# Patient Record
Sex: Female | Born: 1980 | Race: White | Hispanic: No | Marital: Married | Smoking: Never smoker
Health system: Southern US, Community
[De-identification: ages and names within clinical notes are randomized; demographics above are authoritative.]

## PROBLEM LIST (undated history)

## (undated) ENCOUNTER — Ambulatory Visit: Admission: EM | Payer: 59 | Source: Home / Self Care

## (undated) DIAGNOSIS — E119 Type 2 diabetes mellitus without complications: Secondary | ICD-10-CM

## (undated) DIAGNOSIS — I10 Essential (primary) hypertension: Secondary | ICD-10-CM

## (undated) DIAGNOSIS — R000 Tachycardia, unspecified: Secondary | ICD-10-CM

## (undated) DIAGNOSIS — R519 Headache, unspecified: Secondary | ICD-10-CM

## (undated) DIAGNOSIS — M419 Scoliosis, unspecified: Secondary | ICD-10-CM

## (undated) DIAGNOSIS — R42 Dizziness and giddiness: Secondary | ICD-10-CM

## (undated) HISTORY — PX: OTHER SURGICAL HISTORY: SHX169

## (undated) HISTORY — PX: ANKLE SURGERY: SHX546

---

## 2018-06-21 ENCOUNTER — Encounter: Payer: Self-pay | Admitting: Podiatry

## 2018-06-21 ENCOUNTER — Ambulatory Visit: Payer: BC Managed Care – PPO | Admitting: Podiatry

## 2018-06-21 ENCOUNTER — Other Ambulatory Visit: Payer: Self-pay

## 2018-06-21 VITALS — BP 168/98

## 2018-06-21 DIAGNOSIS — L600 Ingrowing nail: Secondary | ICD-10-CM | POA: Diagnosis not present

## 2018-06-21 DIAGNOSIS — M79676 Pain in unspecified toe(s): Secondary | ICD-10-CM

## 2018-06-21 MED ORDER — NEOMYCIN-POLYMYXIN-HC 3.5-10000-1 OT SOLN: OTIC | 0 refills | Status: DC

## 2018-06-21 NOTE — Patient Instructions (Signed)

## 2018-07-04 ENCOUNTER — Ambulatory Visit: Payer: BC Managed Care – PPO | Admitting: Podiatry

## 2018-07-09 NOTE — Progress Notes (Signed)
Subjective:  Patient ID: Crystal Cabrera, female    DOB: November 02, 1980,  MRN: [id number]  Chief Complaint  Patient presents with  . Ingrown Toenail    ingrown toenail left foot lateral side, going on for 3 days now, pt also states that she stubbed it pretty bad recently, pain is elevated when putting pressure on it    38 y.o. female presents with the above complaint.   Review of Systems: Negative except as noted in the HPI. Denies N/V/F/Ch.  No past medical history on file.  Current Outpatient Medications:  .  albuterol (PROVENTIL) (2.5 MG/3ML) 0.083% nebulizer solution, Inhale into the lungs., Disp: , Rfl:  .  amLODipine (NORVASC) 5 MG tablet, Take by mouth., Disp: , Rfl:  .  aspirin 81 MG chewable tablet, Chew by mouth., Disp: , Rfl:  .  carvedilol (COREG) 12.5 MG tablet, Take by mouth., Disp: , Rfl:  .  cloNIDine (CATAPRES) 0.1 MG tablet, TAKE 1 TABLET (0.1 MG TOTAL) BY MOUTH 2 (TWO) TIMES DAILY, Disp: , Rfl:  .  insulin aspart (NOVOLOG FLEXPEN) 100 UNIT/ML FlexPen, Inject 15 units TID AC plus sliding scale for up to 70 units daily, Disp: , Rfl:  .  Insulin Degludec 200 UNIT/ML SOPN, Inject into the skin., Disp: , Rfl:  .  Insulin Pen Needle (FIFTY50 PEN NEEDLES) 31G X 8 MM MISC, Inject up to 5 times daily, Disp: , Rfl:  .  neomycin-polymyxin-hydrocortisone (CORTISPORIN) OTIC solution, Apply 2 drops to the ingrown toenail site twice daily. Cover with band-aid., Disp: 10 mL, Rfl: 0  Social History   Tobacco Use  Smoking Status Never Smoker  Smokeless Tobacco Never Used    No Known Allergies Objective:   Vitals:   06/21/18 1249  BP: (!) 168/98   There is no height or weight on file to calculate BMI. Constitutional Well developed. Well nourished.  Vascular Dorsalis pedis pulses palpable bilaterally. Posterior tibial pulses palpable bilaterally. Capillary refill normal to all digits.  No cyanosis or clubbing noted. Pedal hair growth normal.  Neurologic Normal speech. Oriented  to person, place, and time. Epicritic sensation to light touch grossly present bilaterally.  Dermatologic Painful ingrowing nail at lateral nail borders of the hallux nail left. No other open wounds. No skin lesions.  Orthopedic: Normal joint ROM without pain or crepitus bilaterally. No visible deformities. No bony tenderness.   Radiographs: None Assessment:   1. Ingrown nail   2. Pain around toenail    Plan:  Patient was evaluated and treated and all questions answered.  Ingrown Nail, left -Patient elects to proceed with minor surgery to remove ingrown toenail removal today. Consent reviewed and signed by patient. -Ingrown nail excised. See procedure note. -Educated on post-procedure care including soaking. Written instructions provided and reviewed. -Patient to follow up in 2 weeks for nail check.  Procedure: Excision of Ingrown Toenail Location: Left 1st lateral nail borders. Anesthesia: Lidocaine 1% plain; 1.5 mL and Marcaine 0.5% plain; 1.5 mL, digital block. Skin Prep: Betadine. Dressing: Silvadene; telfa; dry, sterile, compression dressing. Technique: Following skin prep, the toe was exsanguinated and a tourniquet was secured at the base of the toe. The affected nail border was freed, split with a nail splitter, and excised. Chemical matrixectomy was then performed with phenol and irrigated out with alcohol. The tourniquet was then removed and sterile dressing applied. Disposition: Patient tolerated procedure well. Patient to return in 2 weeks for follow-up.   Return in about 2 weeks (around 07/05/2018) for Nail Check with Nurse.

## 2018-12-25 ENCOUNTER — Ambulatory Visit
Admission: EM | Admit: 2018-12-25 | Discharge: 2018-12-25 | Disposition: A | Discharge status: Home / Self Care | Payer: BC Managed Care – PPO | Attending: Family Medicine | Admitting: Family Medicine

## 2018-12-25 ENCOUNTER — Other Ambulatory Visit: Payer: Self-pay

## 2018-12-25 ENCOUNTER — Encounter: Payer: Self-pay | Admitting: Emergency Medicine

## 2018-12-25 DIAGNOSIS — B349 Viral infection, unspecified: Secondary | ICD-10-CM | POA: Diagnosis not present

## 2018-12-25 DIAGNOSIS — J029 Acute pharyngitis, unspecified: Secondary | ICD-10-CM | POA: Diagnosis not present

## 2018-12-25 HISTORY — DX: Essential (primary) hypertension: I10

## 2018-12-25 HISTORY — DX: Type 2 diabetes mellitus without complications: E11.9

## 2018-12-25 LAB — RAPID STREP SCREEN (MED CTR MEBANE ONLY): Streptococcus, Group A Screen (Direct): NEGATIVE

## 2018-12-25 NOTE — ED Provider Notes (Addendum)
[facility]    CSN: [id number] Arrival date & time: 12/25/18  1546      History   Chief Complaint Chief Complaint  Patient presents with  . Sore Throat  . Generalized Body Aches  . Headache    HPI Crystal Cabrera is a 38 y.o. female.   38 yo female with a c/o sore throat, headache, bodyaches, cough since yesterday. Denies any fevers, shortness of breath.      Past Medical History:  Diagnosis Date  . Diabetes mellitus without complication ([place])   . Hypertension     There are no active problems to display for this patient.   History reviewed. No pertinent surgical history.  OB History   No obstetric history on file.      Home Medications    Prior to Admission medications   Medication Sig Start Date End Date Taking? Authorizing Provider  amLODipine (NORVASC) 5 MG tablet Take by mouth. 01/31/17 03/11/19 Yes [provider]  aspirin 81 MG chewable tablet Chew by mouth.   Yes [provider]  carvedilol (COREG) 12.5 MG tablet Take by mouth. 12/18/16  Yes [provider]  cloNIDine (CATAPRES) 0.1 MG tablet TAKE 1 TABLET (0.1 MG TOTAL) BY MOUTH 2 (TWO) TIMES DAILY 01/18/18  Yes [provider]  insulin aspart (NOVOLOG FLEXPEN) 100 UNIT/ML FlexPen Inject 15 units TID AC plus sliding scale for up to 70 units daily 03/11/18  Yes [provider]  Insulin Degludec 200 UNIT/ML SOPN Inject into the skin. 03/11/18  Yes [provider]  neomycin-polymyxin-hydrocortisone (CORTISPORIN) OTIC solution Apply 2 drops to the ingrown toenail site twice daily. Cover with band-aid. 06/21/18  Yes Evelina Bucy, DPM  albuterol (PROVENTIL) (2.5 MG/3ML) 0.083% nebulizer solution Inhale into the lungs.    [provider]  Insulin Pen Needle (FIFTY50 PEN NEEDLES) 31G X 8 MM MISC Inject up to 5 times daily 07/12/16   [provider]    Family History History reviewed. No pertinent family history.  Social History  Social History   Tobacco Use  . Smoking status: Never Smoker  . Smokeless tobacco: Never Used  Substance Use Topics  . Alcohol use: Not Currently  . Drug use: Never     Allergies   Patient has no known allergies.   Review of Systems Review of Systems   Physical Exam Triage Vital Signs ED Triage Vitals  Enc Vitals Group     BP 12/25/18 1620 127/88     Pulse Rate 12/25/18 1620 (!) 109     Resp 12/25/18 1620 18     Temp 12/25/18 1620 98.9 F (37.2 C)     Temp Source 12/25/18 1620 Oral     SpO2 12/25/18 1620 100 %     Weight 12/25/18 1619 210 lb (95.3 kg)     Height 12/25/18 1619 5\' 5"  (1.651 m)     Head Circumference --      Peak Flow --      Pain Score 12/25/18 1618 4     Pain Loc --      Pain Edu? --      Excl. in [place]? --    No data found.  Updated Vital Signs BP 127/88 (BP Location: Right Arm)   Pulse (!) 109   Temp 98.9 F (37.2 C) (Oral)   Resp 18   Ht 5\' 5"  (1.651 m)   Wt 95.3 kg   LMP 12/11/2018   SpO2 100%   BMI 34.95 kg/m  Visual Acuity Right Eye Distance:   Left Eye Distance:   Bilateral Distance:    Right Eye Near:   Left Eye Near:    Bilateral Near:     Physical Exam Vitals signs and nursing note reviewed.  Constitutional:      General: She is not in acute distress.    Appearance: She is not ill-appearing, toxic-appearing or diaphoretic.  HENT:     Right Ear: Tympanic membrane normal.     Left Ear: Tympanic membrane normal.     Mouth/Throat:     Pharynx: Posterior oropharyngeal erythema present. No oropharyngeal exudate.  Cardiovascular:     Rate and Rhythm: Tachycardia present.  Pulmonary:     Effort: Pulmonary effort is normal. No respiratory distress.     Breath sounds: No stridor. No wheezing, rhonchi or rales.  Neurological:     Mental Status: She is alert.      UC Treatments / Results  Labs (all labs ordered are listed, but only abnormal results are displayed) Labs Reviewed  RAPID STREP SCREEN ([facility]  ONLY)  NOVEL CORONAVIRUS, NAA (HOSP ORDER, SEND-OUT TO REF LAB; TAT 18-24 HRS)  CULTURE, GROUP A STREP [facility])    EKG   Radiology No results found.  Procedures Procedures (including critical care time)  Medications Ordered in UC Medications - No data to display  Initial Impression / Assessment and Plan / UC Course  I have reviewed the triage vital signs and the nursing notes.  Pertinent labs & imaging results that were available during my care of the patient were reviewed by me and considered in my medical decision making (see chart for details).      Final Clinical Impressions(s) / UC Diagnoses   Final diagnoses:  Viral illness  Viral pharyngitis     Discharge Instructions     Rest, fluids, tylenol/motrin Wait test result    ED Prescriptions    None      1. Lab result and diagnosis reviewed with patient 2. covid test done 3. Recommend supportive treatment as above 4. Follow-up prn if symptoms worsen or don't improve   Controlled Substance Prescriptions [place] Controlled Substance Registry consulted? Not Applicable   Payton Mccallumonty, Tylisha Danis, MD 12/25/18 [id number]    Payton Mccallumonty, Detrich Rakestraw, MD 12/25/18 2047

## 2018-12-25 NOTE — Discharge Instructions (Signed)
Rest, fluids, tylenol/motrin Wait test result

## 2018-12-25 NOTE — ED Triage Notes (Signed)
Patient c/o sore throat, headache, body aches and cough that started this morning. Denies fever.

## 2018-12-26 LAB — NOVEL CORONAVIRUS, NAA (HOSP ORDER, SEND-OUT TO REF LAB; TAT 18-24 HRS): SARS-CoV-2, NAA: NOT DETECTED

## 2018-12-28 LAB — CULTURE, GROUP A STREP (THRC)

## 2019-02-20 ENCOUNTER — Ambulatory Visit
Admission: EM | Admit: 2019-02-20 | Discharge: 2019-02-20 | Disposition: A | Discharge status: Home / Self Care | Payer: BC Managed Care – PPO | Attending: Family Medicine | Admitting: Family Medicine

## 2019-02-20 ENCOUNTER — Other Ambulatory Visit: Payer: Self-pay

## 2019-02-20 DIAGNOSIS — R519 Headache, unspecified: Secondary | ICD-10-CM | POA: Diagnosis not present

## 2019-02-20 DIAGNOSIS — R05 Cough: Secondary | ICD-10-CM | POA: Diagnosis not present

## 2019-02-20 DIAGNOSIS — M791 Myalgia, unspecified site: Secondary | ICD-10-CM

## 2019-02-20 DIAGNOSIS — J111 Influenza due to unidentified influenza virus with other respiratory manifestations: Secondary | ICD-10-CM

## 2019-02-20 DIAGNOSIS — R0981 Nasal congestion: Secondary | ICD-10-CM | POA: Diagnosis not present

## 2019-02-20 LAB — RAPID INFLUENZA A&B ANTIGENS
Influenza A (ARMC): NEGATIVE
Influenza B (ARMC): NEGATIVE

## 2019-02-20 MED ORDER — BENZONATATE 200 MG PO CAPS: 200.0000 mg | ORAL_CAPSULE | Freq: Three times a day (TID) | ORAL | 0 refills | Status: DC | PRN

## 2019-02-20 NOTE — Discharge Instructions (Signed)
Rest.   Fluids.  Flu negative. Awaiting COVID test results (available in 24-48 hours).  Tylenol and ibuprofen as needed.  Cough medication as directed.  Take care  Dr. Lacinda Axon

## 2019-02-20 NOTE — ED Provider Notes (Signed)
[facility]    CSN: [id number] Arrival date & time: 02/20/19  1626  History   Chief Complaint Chief Complaint  Patient presents with  . Cough   HPI   38 year old female presents with multiple complaints.  Patient reports that she has not been feeling well since 6 AM this morning.  She reports cough, chills, body aches, headache, nasal congestion.  No documented fever.  Temperature slightly elevated here at 99.7.  No reported sick contacts.  No medications or interventions tried.  Patient is concerned about COVID-19.  No known exacerbating or relieving factors.  No other complaints.  PMH, Surgical Hx, Family Hx, Social History reviewed and updated as below.  Past Medical History:  Diagnosis Date  . Diabetes mellitus without complication ([place])   . Hypertension   Migraine Noncompliance HLD Tachycardia induced cardiomyopathy  Past Surgical History:  Procedure Laterality Date  . ANKLE SURGERY    . leg sugery  rt    C section  OB History   No obstetric history on file.    Home Medications    Prior to Admission medications   Medication Sig Start Date End Date Taking? Authorizing Provider  albuterol (PROVENTIL) (2.5 MG/3ML) 0.083% nebulizer solution Inhale into the lungs.    [provider]  amLODipine (NORVASC) 5 MG tablet Take by mouth. 01/31/17 03/11/19  [provider]  aspirin 81 MG chewable tablet Chew by mouth.    [provider]  benzonatate (TESSALON) 200 MG capsule Take 1 capsule (200 mg total) by mouth 3 (three) times daily as needed for cough. 02/20/19   Coral Spikes, DO  carvedilol (COREG) 12.5 MG tablet Take by mouth. 12/18/16   [provider]  cloNIDine (CATAPRES) 0.1 MG tablet TAKE 1 TABLET (0.1 MG TOTAL) BY MOUTH 2 (TWO) TIMES DAILY 01/18/18   [provider]  insulin aspart (NOVOLOG FLEXPEN) 100 UNIT/ML FlexPen Inject 15 units TID AC plus sliding scale for up to 70 units daily 03/11/18   [provider]  Insulin Degludec 200 UNIT/ML SOPN Inject into the skin. 03/11/18   [provider]  Insulin Pen Needle (FIFTY50 PEN NEEDLES) 31G X 8 MM MISC Inject up to 5 times daily 07/12/16   [provider]  neomycin-polymyxin-hydrocortisone (CORTISPORIN) OTIC solution Apply 2 drops to the ingrown toenail site twice daily. Cover with band-aid. 06/21/18   Evelina Bucy, DPM   Social History Social History   Tobacco Use  . Smoking status: Never Smoker  . Smokeless tobacco: Never Used  Substance Use Topics  . Alcohol use: Not Currently  . Drug use: Never    Allergies   Patient has no known allergies.   Review of Systems Review of Systems  Constitutional: Positive for chills.  HENT: Positive for congestion.   Respiratory: Positive for cough.   Musculoskeletal:       Body aches.  Neurological: Positive for headaches.   Physical Exam Triage Vital Signs ED Triage Vitals  Enc Vitals Group     BP --      Pulse Rate 02/20/19 1654 100     Resp 02/20/19 1654 18     Temp 02/20/19 1654 99.7 F (37.6 C)     Temp Source 02/20/19 1654 Oral     SpO2 02/20/19 1654 98 %     Weight 02/20/19 1652 210 lb (95.3 kg)     Height --      Head Circumference --      Peak Flow --  Pain Score 02/20/19 1652 7     Pain Loc --      Pain Edu? --      Excl. in [place]? --     Updated Vital Signs Pulse 100   Temp 99.7 F (37.6 C) (Oral)   Resp 18   Wt 95.3 kg   LMP 01/29/2019   SpO2 98%   BMI 34.95 kg/m   Visual Acuity Right Eye Distance:   Left Eye Distance:   Bilateral Distance:    Right Eye Near:   Left Eye Near:    Bilateral Near:     Physical Exam Vitals signs and nursing note reviewed.  Constitutional:      General: She is not in acute distress.    Appearance: She is obese. She is not ill-appearing.  HENT:     Head: Normocephalic and atraumatic.     Right Ear: Tympanic membrane normal.     Left Ear: Tympanic membrane normal.     Mouth/Throat:      Pharynx: Oropharynx is clear. No posterior oropharyngeal erythema.  Eyes:     General:        Right eye: No discharge.        Left eye: No discharge.     Conjunctiva/sclera: Conjunctivae normal.  Cardiovascular:     Rate and Rhythm: Normal rate and regular rhythm.     Heart sounds: Murmur present.  Pulmonary:     Effort: Pulmonary effort is normal.     Breath sounds: Normal breath sounds. No wheezing, rhonchi or rales.  Neurological:     Mental Status: She is alert.  Psychiatric:     Comments: Flat affect. Depressed mood.    UC Treatments / Results  Labs (all labs ordered are listed, but only abnormal results are displayed) Labs Reviewed  RAPID INFLUENZA A&B ANTIGENS ([facility] ONLY)  NOVEL CORONAVIRUS, NAA (HOSP ORDER, SEND-OUT TO REF LAB; TAT 18-24 HRS)    EKG   Radiology No results found.  Procedures Procedures (including critical care time)  Medications Ordered in UC Medications - No data to display  Initial Impression / Assessment and Plan / UC Course  I have reviewed the triage vital signs and the nursing notes.  Pertinent labs & imaging results that were available during my care of the patient were reviewed by me and considered in my medical decision making (see chart for details).    38 year old female presents with an influenza-like illness.  Awaiting Covid testing.  Rapid flu negative today.  Supportive care.  Ibuprofen and Tylenol as needed.  Tessalon Perles for cough.  Final Clinical Impressions(s) / UC Diagnoses   Final diagnoses:  Influenza-like illness     Discharge Instructions     Rest.   Fluids.  Flu negative. Awaiting COVID test results (available in 24-48 hours).  Tylenol and ibuprofen as needed.  Cough medication as directed.  Take care  Dr. Adriana Simas    ED Prescriptions    Medication Sig Dispense Auth. Provider   benzonatate (TESSALON) 200 MG capsule Take 1 capsule (200 mg total) by mouth 3 (three) times daily as needed for cough. 30  capsule Tommie Sams, DO     PDMP not reviewed this encounter.   Tommie Sams, DO 02/21/19 0930

## 2019-02-20 NOTE — ED Triage Notes (Addendum)
Pt cc cough, chills and headaches. Pt states she feels very fatigued this started today. Pt states she had a Covid test done here 2 months ago. The results were negative.

## 2019-02-22 LAB — NOVEL CORONAVIRUS, NAA (HOSP ORDER, SEND-OUT TO REF LAB; TAT 18-24 HRS): SARS-CoV-2, NAA: NOT DETECTED

## 2019-08-11 ENCOUNTER — Other Ambulatory Visit: Payer: Self-pay

## 2019-08-11 ENCOUNTER — Ambulatory Visit
Admission: EM | Admit: 2019-08-11 | Discharge: 2019-08-11 | Disposition: A | Discharge status: Home / Self Care | Payer: BC Managed Care – PPO | Attending: Family Medicine | Admitting: Family Medicine

## 2019-08-11 DIAGNOSIS — H6690 Otitis media, unspecified, unspecified ear: Secondary | ICD-10-CM

## 2019-08-11 DIAGNOSIS — J019 Acute sinusitis, unspecified: Secondary | ICD-10-CM | POA: Diagnosis not present

## 2019-08-11 DIAGNOSIS — H60503 Unspecified acute noninfective otitis externa, bilateral: Secondary | ICD-10-CM | POA: Diagnosis not present

## 2019-08-11 MED ORDER — AZITHROMYCIN 250 MG PO TABS: ORAL_TABLET | ORAL | 0 refills | Status: DC

## 2019-08-11 MED ORDER — NEOMYCIN-POLYMYXIN-HC 3.5-10000-1 OT SUSP: 4.0000 [drp] | Freq: Three times a day (TID) | OTIC | 0 refills | Status: AC

## 2019-08-11 NOTE — ED Provider Notes (Signed)
[facility]    CSN: [id number] Arrival date & time: 08/11/19  1313      History   Chief Complaint Chief Complaint  Patient presents with  . Otalgia    HPI Crystal Cabrera is a 39 y.o. female.   Patient is a 39 year old female with history of diabetes and hypertension who presents with complaint of ear pain and facial swelling.  Patient states her left ear started hurting yesterday morning and by last night her right ear was hurting as well.  She reports dizziness vertigo and increased pain this morning.  She states that now her pain is a little bit better but she is worried that it may get worse again tonight.  She does report some nasal and sinus congestion.  She reports a couple of her children were sick couple weeks ago and one was diagnosed with flu.  She states one son is still being treated for sinusitis now.  She reports no decrease loss of hearing but has report of fullness.  She does report a cough.  She does report green for mucus when she blows her nose.  Patient denies chest pain shortness of breath, dull pain or nausea.  She does state she feels like there is mucus in her throat that she needs to cough up.  She states she has been taking Advil and states that she took an amoxicillin yesterday that was leftover from a sinusitis infection she had several months ago.      Past Medical History:  Diagnosis Date  . Diabetes mellitus without complication ([place])   . Hypertension     There are no problems to display for this patient.   Past Surgical History:  Procedure Laterality Date  . ANKLE SURGERY    . leg sugery  rt      OB History   No obstetric history on file.      Home Medications    Prior to Admission medications   Medication Sig Start Date End Date Taking? Authorizing Provider  amLODipine (NORVASC) 5 MG tablet Take by mouth. 01/31/17 08/11/19 Yes [provider]  aspirin 81 MG chewable tablet Chew by mouth.   Yes [provider]    carvedilol (COREG) 12.5 MG tablet Take by mouth. 12/18/16  Yes [provider]  cloNIDine (CATAPRES) 0.1 MG tablet TAKE 1 TABLET (0.1 MG TOTAL) BY MOUTH 2 (TWO) TIMES DAILY 01/18/18  Yes [provider]  insulin aspart (NOVOLOG FLEXPEN) 100 UNIT/ML FlexPen Inject 15 units TID AC plus sliding scale for up to 70 units daily 03/11/18  Yes [provider]  Insulin Aspart, w/Niacinamide, (FIASP FLEXTOUCH [place]) Inject into the skin.   Yes [provider]  Insulin Pen Needle (FIFTY50 PEN NEEDLES) 31G X 8 MM MISC Inject up to 5 times daily 07/12/16  Yes [provider]  azithromycin (ZITHROMAX Z-PAK) 250 MG tablet Take 2 tablets by mouth the first day followed by one tablet daily for next 4 days. 08/11/19   Luvenia Redden, PA-C  neomycin-polymyxin-hydrocortisone (CORTISPORIN) 3.5-10000-1 OTIC suspension Place 4 drops into both ears 3 (three) times daily for 7 days. 08/11/19 08/18/19  Luvenia Redden, PA-C  albuterol (PROVENTIL) (2.5 MG/3ML) 0.083% nebulizer solution Inhale into the lungs.  08/11/19  [provider]    Family History Family History  Problem Relation Age of Onset  . Healthy Mother   . Healthy Father     Social History Social History   Tobacco Use  . Smoking status:  Never Smoker  . Smokeless tobacco: Never Used  Substance Use Topics  . Alcohol use: Not Currently  . Drug use: Never     Allergies   Patient has no known allergies.   Review of Systems Review of Systems as noted in HPI.  Other systems reviewed and found to be negative   Physical Exam Triage Vital Signs ED Triage Vitals  Enc Vitals Group     BP 08/11/19 1335 135/80     Pulse Rate 08/11/19 1335 (!) 107     Resp 08/11/19 1335 18     Temp 08/11/19 1335 98.4 F (36.9 C)     Temp Source 08/11/19 1335 Oral     SpO2 08/11/19 1335 99 %     Weight --      Height --      Head Circumference --      Peak Flow --      Pain Score 08/11/19 1330 5     Pain Loc --       Pain Edu? --      Excl. in [place]? --    No data found.  Updated Vital Signs BP 135/80 (BP Location: Left Arm)   Pulse (!) 107   Temp 98.4 F (36.9 C) (Oral)   Resp 18   LMP 07/28/2019   SpO2 99%    Physical Exam Constitutional:      Appearance: She is ill-appearing.  HENT:     Head: Normocephalic and atraumatic.     Right Ear: Swelling present. A middle ear effusion is present. Tympanic membrane is erythematous.     Left Ear: Swelling present. A middle ear effusion is present. Tympanic membrane is erythematous.     Ears:     Comments: Tragal and preauricular tenderness bilaterally.  Appearance of white discharge on the left TM and possible white discharge in the distal right canal.    Nose: Congestion present.     Right Turbinates: Swollen.     Left Turbinates: Swollen.     Right Sinus: Maxillary sinus tenderness and frontal sinus tenderness present.     Left Sinus: Maxillary sinus tenderness and frontal sinus tenderness present.     Mouth/Throat:     Tonsils: No tonsillar exudate.  Neurological:     Mental Status: She is alert.      UC Treatments / Results  Labs (all labs ordered are listed, but only abnormal results are displayed) Labs Reviewed - No data to display  EKG   Radiology No results found.  Procedures Procedures (including critical care time)  Medications Ordered in UC Medications - No data to display  Initial Impression / Assessment and Plan / UC Course  I have reviewed the triage vital signs and the nursing notes.  Pertinent labs & imaging results that were available during my care of the patient were reviewed by me and considered in my medical decision making (see chart for details).     Patient complaint and exam consistent with sinusitis and otitis media.  Exam also concerning for possible early otitis externa with white discharge on the left dependent membrane and adjacent on the right.  We will give her prescription for azithromycin  for the sinusitis and the otitis media.  Also give her a prescription for Cortisporin attic for the otitis externa.  Recommend her to try over-the-counter Afrin to help with her opening of her nasal and ear passages to help with drainage.  Also recommend nasal saline or Nettie pot to help  rinse her sinuses.  Ibuprofen or Tylenol for pain  Final Clinical Impressions(s) / UC Diagnoses   Final diagnoses:  Acute otitis externa of both ears, unspecified type  Acute otitis media, unspecified otitis media type  Acute sinusitis, recurrence not specified, unspecified location     Discharge Instructions     -azithromycin: Take 2 tablets by mouth the first day followed by one tablet daily for next 4 days. -Cortisporin: 4 drops into both ears three times daily for 7 days -Can use OTC Afrin to help open nasal/ear passages to help with drainage -nasal saline or Nettie Pot to help rinse sinuses -ibuprofen/Tylenol for pain    ED Prescriptions    Medication Sig Dispense Auth. Provider   azithromycin (ZITHROMAX Z-PAK) 250 MG tablet Take 2 tablets by mouth the first day followed by one tablet daily for next 4 days. 6 tablet Candis Schatz, PA-C   neomycin-polymyxin-hydrocortisone (CORTISPORIN) 3.5-10000-1 OTIC suspension Place 4 drops into both ears 3 (three) times daily for 7 days. 10 mL Candis Schatz, PA-C     PDMP not reviewed this encounter.   Candis Schatz, PA-C 08/11/19 1404

## 2019-08-11 NOTE — Discharge Instructions (Addendum)
-  azithromycin: Take 2 tablets by mouth the first day followed by one tablet daily for next 4 days. -Cortisporin: 4 drops into both ears three times daily for 7 days -Can use OTC Afrin to help open nasal/ear passages to help with drainage -nasal saline or Nettie Pot to help rinse sinuses -ibuprofen/Tylenol for pain

## 2019-08-11 NOTE — ED Triage Notes (Signed)
Pt reports L ear pain starting yesterday.  Was initially a fullness.  This morning now has sharp pain in both ears and reports a feeling like they are swollen inside. Reports intermittent vertigo as well as tenderness on cheeks and along sinuses. States her sons have been sick - tested negative for COVID but one tested positive for flu a couple weeks ago.  Took a leftover Amoxicillin 500mg  last night as well as Advil.

## 2019-09-15 ENCOUNTER — Ambulatory Visit
Admission: EM | Admit: 2019-09-15 | Discharge: 2019-09-15 | Disposition: A | Discharge status: Home / Self Care | Payer: BC Managed Care – PPO | Attending: Family Medicine | Admitting: Family Medicine

## 2019-09-15 ENCOUNTER — Other Ambulatory Visit: Payer: Self-pay

## 2019-09-15 DIAGNOSIS — E1165 Type 2 diabetes mellitus with hyperglycemia: Secondary | ICD-10-CM

## 2019-09-15 DIAGNOSIS — Z760 Encounter for issue of repeat prescription: Secondary | ICD-10-CM | POA: Diagnosis not present

## 2019-09-15 DIAGNOSIS — I10 Essential (primary) hypertension: Secondary | ICD-10-CM

## 2019-09-15 DIAGNOSIS — Z794 Long term (current) use of insulin: Secondary | ICD-10-CM | POA: Diagnosis not present

## 2019-09-15 MED ORDER — CARVEDILOL 12.5 MG PO TABS: 12.5000 mg | ORAL_TABLET | Freq: Two times a day (BID) | ORAL | 1 refills | Status: AC

## 2019-09-15 MED ORDER — FIASP FLEXTOUCH 100 UNIT/ML ~~LOC~~ SOPN: 34.0000 [IU] | PEN_INJECTOR | Freq: Three times a day (TID) | SUBCUTANEOUS | 3 refills | Status: AC

## 2019-09-15 MED ORDER — LISINOPRIL 10 MG PO TABS: 10.0000 mg | ORAL_TABLET | Freq: Every day | ORAL | 1 refills | Status: AC

## 2019-09-15 MED ORDER — CLONIDINE HCL 0.1 MG PO TABS: ORAL_TABLET | ORAL | 1 refills | Status: AC

## 2019-09-15 MED ORDER — AMLODIPINE BESYLATE 5 MG PO TABS: 5.0000 mg | ORAL_TABLET | Freq: Every day | ORAL | 1 refills | Status: AC

## 2019-09-15 NOTE — ED Triage Notes (Signed)
Pt states she can't get into her doctor until the end of the month and needs several prescriptions refilled.

## 2019-09-15 NOTE — Discharge Instructions (Addendum)
I have refilled your medications   Follow up with PCP.  Take care  Dr. Adriana Simas

## 2019-09-15 NOTE — ED Provider Notes (Signed)
[facility]    CSN: [id number] Arrival date & time: 09/15/19  1523  History   Chief Complaint Chief Complaint  Patient presents with  . Medication Refill    HPI  39 year old female presents requesting refills of her medications.  Patient has not seen her physician in over 1 year.  Patient states that she is going out of town.  She is in need of refills on her hypertension medications as well as her insulin.  She has uncontrolled diabetes.  She has not taken her blood sugar today.  Patient states that her blood sugars have been worse as this has been a stressful year due to the pandemic.  Her blood pressure is currently well controlled.  She is requesting refills on Fiasp, amlodipine, lisinopril, carvedilol, clonidine.  No other complaints or concerns at this time.  Past Medical History:  Diagnosis Date  . Diabetes mellitus without complication (HCC)   . Hypertension    Past Surgical History:  Procedure Laterality Date  . ANKLE SURGERY    . CESAREAN SECTION    . leg sugery  rt     OB History   No obstetric history on file.    Home Medications    Prior to Admission medications   Medication Sig Start Date End Date Taking? Authorizing Provider  amLODipine (NORVASC) 5 MG tablet Take 1 tablet (5 mg total) by mouth daily. 09/15/19   Tommie Sams, DO  aspirin 81 MG chewable tablet Chew by mouth.    [provider]  carvedilol (COREG) 12.5 MG tablet Take 1 tablet (12.5 mg total) by mouth 2 (two) times daily with a meal. 09/15/19   Taurus Alamo, Verdis Frederickson, DO  cloNIDine (CATAPRES) 0.1 MG tablet TAKE 1 TABLET (0.1 MG TOTAL) BY MOUTH 2 (TWO) TIMES DAILY 09/15/19   Adriana Simas, Rohini Jaroszewski G, DO  insulin aspart (FIASP FLEXTOUCH) 100 UNIT/ML FlexTouch Pen Inject 34 Units into the skin with breakfast, with lunch, and with evening meal. 09/15/19   Clarann Helvey G, DO  insulin aspart (NOVOLOG FLEXPEN) 100 UNIT/ML FlexPen Inject 15 units TID AC plus sliding scale for up to 70 units daily 03/11/18    [provider]  Insulin Pen Needle (FIFTY50 PEN NEEDLES) 31G X 8 MM MISC Inject up to 5 times daily 07/12/16   [provider]  lisinopril (ZESTRIL) 10 MG tablet Take 1 tablet (10 mg total) by mouth daily. 09/15/19   Tommie Sams, DO  albuterol (PROVENTIL) (2.5 MG/3ML) 0.083% nebulizer solution Inhale into the lungs.  08/11/19  [provider]    Family History Family History  Problem Relation Age of Onset  . Healthy Mother   . Healthy Father     Social History Social History   Tobacco Use  . Smoking status: Never Smoker  . Smokeless tobacco: Never Used  Substance Use Topics  . Alcohol use: Not Currently  . Drug use: Never     Allergies   Patient has no known allergies.   Review of Systems Review of Systems Per HPI  Physical Exam Triage Vital Signs ED Triage Vitals  Enc Vitals Group     BP 09/15/19 1534 132/70     Pulse Rate 09/15/19 1534 (!) 120     Resp 09/15/19 1534 19     Temp 09/15/19 1534 98 F (36.7 C)     Temp Source 09/15/19 1534 Oral     SpO2 09/15/19 1534 99 %     Weight 09/15/19 1531 210 lb (95.3  kg)     Height 09/15/19 1531 5\' 5"  (1.651 m)     Head Circumference --      Peak Flow --      Pain Score 09/15/19 1531 0     Pain Loc --      Pain Edu? --      Excl. in [place]? --    Updated Vital Signs BP 132/70 (BP Location: Left Arm)   Pulse (!) 120   Temp 98 F (36.7 C) (Oral)   Resp 19   Ht 5\' 5"  (1.651 m)   Wt 95.3 kg   LMP 08/25/2019   SpO2 99%   BMI 34.95 kg/m   Visual Acuity Right Eye Distance:   Left Eye Distance:   Bilateral Distance:    Right Eye Near:   Left Eye Near:    Bilateral Near:     Physical Exam Vitals and nursing note reviewed.  Constitutional:      General: She is not in acute distress.    Appearance: Normal appearance. She is obese. She is not ill-appearing.  HENT:     Head: Normocephalic and atraumatic.  Eyes:     General:        Right eye: No discharge.        Left eye: No discharge.      Conjunctiva/sclera: Conjunctivae normal.  Cardiovascular:     Rate and Rhythm: Regular rhythm. Tachycardia present.  Pulmonary:     Effort: Pulmonary effort is normal.     Breath sounds: Normal breath sounds. No wheezing, rhonchi or rales.  Neurological:     Mental Status: She is alert.  Psychiatric:        Mood and Affect: Mood normal.        Behavior: Behavior normal.    UC Treatments / Results  Labs (all labs ordered are listed, but only abnormal results are displayed) Labs Reviewed - No data to display  EKG   Radiology No results found.  Procedures Procedures (including critical care time)  Medications Ordered in UC Medications - No data to display  Initial Impression / Assessment and Plan / UC Course  I have reviewed the triage vital signs and the nursing notes.  Pertinent labs & imaging results that were available during my care of the patient were reviewed by me and considered in my medical decision making (see chart for details).    39 year old female presents requesting medication refill.  Diabetes remains uncontrolled.  Patient reports that her blood sugars have been worse since the pandemic.  Patient states that she is scheduled for primary care follow-up later this month.  Fiasp refilled.  Blood pressure currently well controlled.  Amlodipine, lisinopril, carvedilol, clonidine refilled today.   Final Clinical Impressions(s) / UC Diagnoses   Final diagnoses:  Medication refill  Type 2 diabetes mellitus with hyperglycemia, with long-term current use of insulin ([place])  Essential hypertension     Discharge Instructions     I have refilled your medications   Follow up with PCP.  Take care  Dr. Lacinda Axon     ED Prescriptions    Medication Sig Dispense Auth. Provider   amLODipine (NORVASC) 5 MG tablet Take 1 tablet (5 mg total) by mouth daily. 30 tablet Neo Yepiz G, DO   carvedilol (COREG) 12.5 MG tablet Take 1 tablet (12.5 mg total) by mouth 2  (two) times daily with a meal. 60 tablet Jaymie Mckiddy G, DO   cloNIDine (CATAPRES) 0.1 MG tablet TAKE 1 TABLET (  0.1 MG TOTAL) BY MOUTH 2 (TWO) TIMES DAILY 60 tablet Gatlin Kittell G, DO   lisinopril (ZESTRIL) 10 MG tablet Take 1 tablet (10 mg total) by mouth daily. 30 tablet Vladislav Axelson G, DO   insulin aspart (FIASP FLEXTOUCH) 100 UNIT/ML FlexTouch Pen Inject 34 Units into the skin with breakfast, with lunch, and with evening meal. 90 mL Everlene Other G, DO     PDMP not reviewed this encounter.   Tommie Sams, [place] 09/15/19 1810

## 2019-10-08 ENCOUNTER — Other Ambulatory Visit: Payer: Self-pay | Admitting: Family Medicine

## 2019-11-17 ENCOUNTER — Other Ambulatory Visit: Payer: Self-pay | Admitting: Family Medicine

## 2020-04-19 ENCOUNTER — Ambulatory Visit
Admission: EM | Admit: 2020-04-19 | Discharge: 2020-04-19 | Disposition: A | Discharge status: Home / Self Care | Payer: BC Managed Care – PPO | Attending: Family Medicine | Admitting: Family Medicine

## 2020-04-19 ENCOUNTER — Other Ambulatory Visit: Payer: Self-pay

## 2020-04-19 DIAGNOSIS — Z79899 Other long term (current) drug therapy: Secondary | ICD-10-CM | POA: Insufficient documentation

## 2020-04-19 DIAGNOSIS — Z20822 Contact with and (suspected) exposure to covid-19: Secondary | ICD-10-CM | POA: Insufficient documentation

## 2020-04-19 DIAGNOSIS — J069 Acute upper respiratory infection, unspecified: Secondary | ICD-10-CM | POA: Diagnosis present

## 2020-04-19 MED ORDER — PROMETHAZINE-DM 6.25-15 MG/5ML PO SYRP: 5.0000 mL | ORAL_SOLUTION | Freq: Four times a day (QID) | ORAL | 0 refills | Status: DC | PRN

## 2020-04-19 MED ORDER — BENZONATATE 100 MG PO CAPS: 200.0000 mg | ORAL_CAPSULE | Freq: Three times a day (TID) | ORAL | 0 refills | Status: DC

## 2020-04-19 MED ORDER — ALBUTEROL SULFATE HFA 108 (90 BASE) MCG/ACT IN AERS: 2.0000 | INHALATION_SPRAY | RESPIRATORY_TRACT | 0 refills | Status: DC | PRN

## 2020-04-19 MED ORDER — AEROCHAMBER MV MISC: 2 refills | Status: DC

## 2020-04-19 NOTE — Discharge Instructions (Addendum)
Isolate at home until the results of your COVID test are back.  If the test is positive you will need to quarantine for 5 additional days.  After the 5 days if your symptoms have improved then you can break quarantine.  Use the albuterol inhaler with a spacer, 2 puffs every 4-6 hours, as needed for shortness of breath and wheezing.  Use the Tessalon Perles during the day as needed for cough and the Promethazine DM at nighttime as needed for cough and congestion.  If you develop worsening shortness of breath-especially at rest, you are unable to speak full sentences, or you develop bluing of your lips you need to go to the ER for evaluation.

## 2020-04-19 NOTE — ED Provider Notes (Signed)
[facility]    CSN: [id number] Arrival date & time: 04/19/20  1542      History   Chief Complaint Chief Complaint  Patient presents with  . Generalized Body Aches    HPI Crystal Cabrera is a 40 y.o. female.   HPI   40 year old female here for evaluation of COVID-like symptoms that been going on the past 5 days.  Patient reports that she has been experiencing headaches, body aches, runny nose and nasal congestion, ear pressure, productive cough, shortness of breath and wheezing.  Patient denies GI complaints.  Patient has had both of her COVID-vaccine plus her booster shot.  Patient states that when she first developed symptoms she [facility] which was [facility] and also reports that her symptoms have been worsening ever since.  Patient is concerned because she has been admitted to the ICU for pneumonia in the past.  Past Medical History:  Diagnosis Date  . Diabetes mellitus without complication (HCC)   . Hypertension     There are no problems to display for this patient.   Past Surgical History:  Procedure Laterality Date  . ANKLE SURGERY    . CESAREAN SECTION    . leg sugery  rt      OB History   No obstetric history on file.      Home Medications    Prior to Admission medications   Medication Sig Start Date End Date Taking? Authorizing Provider  albuterol (VENTOLIN HFA) 108 (90 Base) MCG/ACT inhaler Inhale 2 puffs into the lungs every 4 (four) hours as needed. 04/19/20  Yes Becky Augusta, NP  amLODipine (NORVASC) 5 MG tablet Take 1 tablet (5 mg total) by mouth daily. 09/15/19  Yes Cook, Jayce G, DO  aspirin 81 MG chewable tablet Chew by mouth.   Yes [provider]  benzonatate (TESSALON) 100 MG capsule Take 2 capsules (200 mg total) by mouth every 8 (eight) hours. 04/19/20  Yes Becky Augusta, NP  carvedilol (COREG) 12.5 MG tablet Take 1 tablet (12.5 mg total) by mouth 2 (two) times daily with a meal. 09/15/19  Yes Cook, Jayce G, DO   cloNIDine (CATAPRES) 0.1 MG tablet TAKE 1 TABLET (0.1 MG TOTAL) BY MOUTH 2 (TWO) TIMES DAILY 09/15/19  Yes Cook, Jayce G, DO  insulin aspart (FIASP FLEXTOUCH) 100 UNIT/ML FlexTouch Pen Inject 34 Units into the skin with breakfast, with lunch, and with evening meal. 09/15/19  Yes Cook, Jayce G, DO  insulin aspart (NOVOLOG) 100 UNIT/ML FlexPen Inject 15 units TID AC plus sliding scale for up to 70 units daily 03/11/18  Yes [provider]  Insulin Pen Needle 31G X 8 MM MISC Inject up to 5 times daily 07/12/16  Yes [provider]  lisinopril (ZESTRIL) 10 MG tablet Take 1 tablet (10 mg total) by mouth daily. 09/15/19  Yes Cook, Jayce G, DO  promethazine-dextromethorphan (PROMETHAZINE-DM) 6.25-15 MG/5ML syrup Take 5 mLs by mouth 4 (four) times daily as needed. 04/19/20  Yes Becky Augusta, NP  Spacer/Aero-Holding Chambers (AEROCHAMBER MV) inhaler Use as instructed 04/19/20  Yes Becky Augusta, NP  TRESIBA FLEXTOUCH 200 UNIT/ML FlexTouch Pen Inject into the skin. 03/25/20  Yes [provider]    Family History Family History  Problem Relation Age of Onset  . Healthy Mother   . Healthy Father     Social History Social History   Tobacco Use  . Smoking status: Never Smoker  . Smokeless tobacco: Never Used  Vaping  Use  . Vaping Use: Never used  Substance Use Topics  . Alcohol use: Not Currently  . Drug use: Never     Allergies   Patient has no known allergies.   Review of Systems Review of Systems  Constitutional: Negative for fever.  HENT: Positive for congestion, ear pain, rhinorrhea and sore throat.   Respiratory: Positive for cough, shortness of breath and wheezing.   Gastrointestinal: Negative for vomiting.  Musculoskeletal: Positive for arthralgias and myalgias.  Skin: Negative for rash.  Neurological: Positive for headaches.  Hematological: Negative.   Psychiatric/Behavioral: Negative.      Physical Exam Triage Vital Signs ED Triage Vitals  Enc Vitals  Group     BP 04/19/20 1739 (!) 152/89     Pulse Rate 04/19/20 1739 (!) 125     Resp 04/19/20 1739 18     Temp 04/19/20 1739 98.1 F (36.7 C)     Temp Source 04/19/20 1739 Oral     SpO2 04/19/20 1739 100 %     Weight 04/19/20 1736 220 lb 12.8 oz (100.2 kg)     Height --      Head Circumference --      Peak Flow --      Pain Score 04/19/20 1736 6     Pain Loc --      Pain Edu? --      Excl. in [place]? --    No data found.  Updated Vital Signs BP (!) 152/89 (BP Location: Left Arm)   Pulse (!) 125   Temp 98.1 F (36.7 C) (Oral)   Resp 18   Wt 220 lb 12.8 oz (100.2 kg)   LMP 03/29/2020   SpO2 100%   BMI 36.74 kg/m   Visual Acuity Right Eye Distance:   Left Eye Distance:   Bilateral Distance:    Right Eye Near:   Left Eye Near:    Bilateral Near:     Physical Exam Vitals and nursing note reviewed.  Constitutional:      General: She is not in acute distress.    Appearance: Normal appearance. She is not toxic-appearing.  HENT:     Head: Normocephalic and atraumatic.     Right Ear: Tympanic membrane, ear canal and external ear normal.     Left Ear: Tympanic membrane, ear canal and external ear normal.     Nose: Congestion and rhinorrhea present.     Comments: Nasal mucosa is erythematous and edematous with clear nasal discharge.    Mouth/Throat:     Mouth: Mucous membranes are moist.     Pharynx: Oropharynx is clear. Posterior oropharyngeal erythema present.     Comments: Posterior oropharynx has mild erythema with clear postnasal drip. Cardiovascular:     Rate and Rhythm: Normal rate and regular rhythm.     Pulses: Normal pulses.     Heart sounds: Normal heart sounds. No murmur heard. No gallop.   Pulmonary:     Effort: Pulmonary effort is normal.     Breath sounds: Normal breath sounds. No wheezing, rhonchi or rales.  Skin:    General: Skin is warm and dry.     Capillary Refill: Capillary refill takes less than 2 seconds.     Findings: No erythema or rash.   Neurological:     General: No focal deficit present.     Mental Status: She is alert and oriented to person, place, and time.  Psychiatric:        Mood and Affect: Mood  normal.        Behavior: Behavior normal.        Thought Content: Thought content normal.        Judgment: Judgment normal.      UC Treatments / Results  Labs (all labs ordered are listed, but only abnormal results are displayed) Labs Reviewed  SARS CORONAVIRUS 2 (TAT 6-24 HRS)    EKG   Radiology No results found.  Procedures Procedures (including critical care time)  Medications Ordered in UC Medications - No data to display  Initial Impression / Assessment and Plan / UC Course  I have reviewed the triage vital signs and the nursing notes.  Pertinent labs & imaging results that were available during my care of the patient were reviewed by me and considered in my medical decision making (see chart for details).   For evaluation of COVID-like symptoms that been going on for the past 5 days.  Patient reports that her symptoms have been worsening and not improving.  Patient has been tested for COVID at the early onset of her symptoms which was negative.  Patient is in no acute distress.  Will swab patient for COVID and discharge patient home with an albuterol inhaler and spacer for shortness of breath and wheezing, Tessalon Perles and Promethazine DM for cough and congestion, and have patient isolate pending the results of her COVID test.  Given the patient is at 5 days with her symptoms not improving will have patient quarantine for additional 5 days if her test is positive.   Final Clinical Impressions(s) / UC Diagnoses   Final diagnoses:  Viral URI with cough     Discharge Instructions     Isolate at home until the results of your COVID test are back.  If the test is positive you will need to quarantine for 5 additional days.  After the 5 days if your symptoms have improved then you can break  quarantine.  Use the albuterol inhaler with a spacer, 2 puffs every 4-6 hours, as needed for shortness of breath and wheezing.  Use the Tessalon Perles during the day as needed for cough and the Promethazine DM at nighttime as needed for cough and congestion.  If you develop worsening shortness of breath-especially at rest, you are unable to speak full sentences, or you develop bluing of your lips you need to go to the ER for evaluation.    ED Prescriptions    Medication Sig Dispense Auth. Provider   benzonatate (TESSALON) 100 MG capsule Take 2 capsules (200 mg total) by mouth every 8 (eight) hours. 21 capsule Becky Augusta, NP   Spacer/Aero-Holding Chambers (AEROCHAMBER MV) inhaler Use as instructed 1 each Becky Augusta, NP   albuterol (VENTOLIN HFA) 108 (90 Base) MCG/ACT inhaler Inhale 2 puffs into the lungs every 4 (four) hours as needed. 18 g Becky Augusta, NP   promethazine-dextromethorphan (PROMETHAZINE-DM) 6.25-15 MG/5ML syrup Take 5 mLs by mouth 4 (four) times daily as needed. 118 mL Becky Augusta, NP     PDMP not reviewed this encounter.   Becky Augusta, NP 04/19/20 1806

## 2020-04-19 NOTE — ED Triage Notes (Signed)
Patient complains of body aches, cough, sore throat, body aches, nasal congestion, headaches x 5 days. States that she was [facility]bed at [facility] the day her symptoms started but has been worsening since.

## 2020-04-20 LAB — SARS CORONAVIRUS 2 (TAT 6-24 HRS): SARS Coronavirus 2: NEGATIVE

## 2020-05-18 ENCOUNTER — Other Ambulatory Visit: Payer: Self-pay | Admitting: Otolaryngology

## 2020-05-18 DIAGNOSIS — R42 Dizziness and giddiness: Secondary | ICD-10-CM

## 2020-05-25 ENCOUNTER — Other Ambulatory Visit: Payer: Self-pay

## 2020-05-25 ENCOUNTER — Ambulatory Visit
Admission: RE | Admit: 2020-05-25 | Discharge: 2020-05-25 | Disposition: A | Discharge status: Home / Self Care | Payer: BC Managed Care – PPO | Source: Ambulatory Visit | Attending: Otolaryngology | Admitting: Otolaryngology

## 2020-05-25 DIAGNOSIS — R42 Dizziness and giddiness: Secondary | ICD-10-CM | POA: Diagnosis not present

## 2020-05-25 MED ORDER — GADOBUTROL 1 MMOL/ML IV SOLN
10.0000 mL | Freq: Once | INTRAVENOUS | Status: AC | PRN
  Administered 2020-05-25: 10 mL via INTRAVENOUS

## 2020-05-27 ENCOUNTER — Emergency Department: Payer: BC Managed Care – PPO

## 2020-05-27 ENCOUNTER — Other Ambulatory Visit: Payer: Self-pay

## 2020-05-27 ENCOUNTER — Emergency Department
Admission: EM | Admit: 2020-05-27 | Discharge: 2020-05-27 | Disposition: A | Discharge status: Home / Self Care | Payer: BC Managed Care – PPO | Attending: Student in an Organized Health Care Education/Training Program | Admitting: Student in an Organized Health Care Education/Training Program

## 2020-05-27 DIAGNOSIS — E119 Type 2 diabetes mellitus without complications: Secondary | ICD-10-CM | POA: Diagnosis not present

## 2020-05-27 DIAGNOSIS — R519 Headache, unspecified: Secondary | ICD-10-CM | POA: Diagnosis not present

## 2020-05-27 DIAGNOSIS — I10 Essential (primary) hypertension: Secondary | ICD-10-CM | POA: Insufficient documentation

## 2020-05-27 DIAGNOSIS — Z7982 Long term (current) use of aspirin: Secondary | ICD-10-CM | POA: Insufficient documentation

## 2020-05-27 DIAGNOSIS — Z794 Long term (current) use of insulin: Secondary | ICD-10-CM | POA: Diagnosis not present

## 2020-05-27 DIAGNOSIS — Z79899 Other long term (current) drug therapy: Secondary | ICD-10-CM | POA: Diagnosis not present

## 2020-05-27 DIAGNOSIS — R202 Paresthesia of skin: Secondary | ICD-10-CM | POA: Diagnosis not present

## 2020-05-27 LAB — COMPREHENSIVE METABOLIC PANEL
ALT: 45 U/L — ABNORMAL HIGH (ref 0–44)
AST: 39 U/L (ref 15–41)
Albumin: 3.8 g/dL (ref 3.5–5.0)
Alkaline Phosphatase: 55 U/L (ref 38–126)
Anion gap: 13 (ref 5–15)
BUN: 13 mg/dL (ref 6–20)
CO2: 25 mmol/L (ref 22–32)
Calcium: 9.1 mg/dL (ref 8.9–10.3)
Chloride: 95 mmol/L — ABNORMAL LOW (ref 98–111)
Creatinine, Ser: 0.68 mg/dL (ref 0.44–1.00)
GFR, Estimated: >60 mL/min (ref >60)
Glucose, Bld: 361 mg/dL — ABNORMAL HIGH (ref 70–99)
Potassium: 4.2 mmol/L (ref 3.5–5.1)
Sodium: 133 mmol/L — ABNORMAL LOW (ref 135–145)
Total Bilirubin: 0.8 mg/dL (ref 0.3–1.2)
Total Protein: 7.2 g/dL (ref 6.5–8.1)

## 2020-05-27 LAB — URINALYSIS, ROUTINE W REFLEX MICROSCOPIC
Bacteria, UA: NONE SEEN
Bilirubin Urine: NEGATIVE
Glucose, UA: >=500 mg/dL — AB
Ketones, ur: 20 mg/dL — AB
Leukocytes,Ua: NEGATIVE
Nitrite: NEGATIVE
Protein, ur: NEGATIVE mg/dL
RBC / HPF: >50 RBC/hpf — ABNORMAL HIGH (ref 0–5)
Specific Gravity, Urine: 1.029 (ref 1.005–1.030)
pH: 5 (ref 5.0–8.0)

## 2020-05-27 LAB — CBC WITH DIFFERENTIAL/PLATELET
Abs Immature Granulocytes: 0.05 10*3/uL (ref 0.00–0.07)
Basophils Absolute: 0.1 10*3/uL (ref 0.0–0.1)
Basophils Relative: 1 %
Eosinophils Absolute: 0.5 10*3/uL (ref 0.0–0.5)
Eosinophils Relative: 6 %
HCT: 41 % (ref 36.0–46.0)
Hemoglobin: 13.9 g/dL (ref 12.0–15.0)
Immature Granulocytes: 1 %
Lymphocytes Relative: 31 %
Lymphs Abs: 2.8 10*3/uL (ref 0.7–4.0)
MCH: 28.4 pg (ref 26.0–34.0)
MCHC: 33.9 g/dL (ref 30.0–36.0)
MCV: 83.7 fL (ref 80.0–100.0)
Monocytes Absolute: 0.9 10*3/uL (ref 0.1–1.0)
Monocytes Relative: 10 %
Neutro Abs: 4.7 10*3/uL (ref 1.7–7.7)
Neutrophils Relative %: 51 %
Platelets: 292 10*3/uL (ref 150–400)
RBC: 4.9 MIL/uL (ref 3.87–5.11)
RDW: 13.4 % (ref 11.5–15.5)
WBC: 9 10*3/uL (ref 4.0–10.5)
nRBC: 0 % (ref 0.0–0.2)

## 2020-05-27 LAB — POC URINE PREG, ED: Preg Test, Ur: NEGATIVE

## 2020-05-27 MED ORDER — DIPHENHYDRAMINE HCL 50 MG/ML IJ SOLN
12.5000 mg | Freq: Once | INTRAMUSCULAR | Status: AC
  Administered 2020-05-27: 12.5 mg via INTRAVENOUS
  Filled 2020-05-27: qty 1

## 2020-05-27 MED ORDER — IOHEXOL 350 MG/ML SOLN
75.0000 mL | Freq: Once | INTRAVENOUS | Status: AC | PRN
  Administered 2020-05-27: 75 mL via INTRAVENOUS

## 2020-05-27 MED ORDER — PROCHLORPERAZINE EDISYLATE 10 MG/2ML IJ SOLN
10.0000 mg | Freq: Once | INTRAMUSCULAR | Status: AC
  Administered 2020-05-27: 10 mg via INTRAVENOUS
  Filled 2020-05-27: qty 2

## 2020-05-27 MED ORDER — SODIUM CHLORIDE 0.9 % IV BOLUS
1000.0000 mL | Freq: Once | INTRAVENOUS | Status: AC
  Administered 2020-05-27: 1000 mL via INTRAVENOUS

## 2020-05-27 MED ORDER — ACETAMINOPHEN 500 MG PO TABS
1000.0000 mg | ORAL_TABLET | Freq: Once | ORAL | Status: AC
  Administered 2020-05-27: 1000 mg via ORAL
  Filled 2020-05-27: qty 2

## 2020-05-27 NOTE — ED Notes (Signed)
D/C discussed with pt and mom both verbalized understanding. NAD noted.

## 2020-05-27 NOTE — ED Notes (Signed)
Pt at CT

## 2020-05-27 NOTE — Discharge Instructions (Signed)

## 2020-05-27 NOTE — ED Triage Notes (Addendum)
Pt states she has been seeing an ENT for sinus infection with vertigo issues for the past month and today when she woke up she had numbness from the bridge of her nose down around her mouth and chin that lasted for about , c/o having a severe headache, states she felt fine when she went to bed last night. ENT did a MRI on Tuesday, results in the chart

## 2020-05-27 NOTE — ED Notes (Signed)
Waiting for IVF to finishing infusing before D/C.

## 2020-05-27 NOTE — ED Notes (Signed)
POC urine preg, Negative, MD aware.  Pt passed yale swallow screen, MD aware. Tylenol given after yale swallow screen passed, MD aware.

## 2020-05-27 NOTE — ED Notes (Signed)
Pt presents to ED with c/o of having numbness around here lips and nose that started this morning upon awakening and also a headache that is "severe". Pt denies a thunderclap type headache. Pt states she has been having issues with her sinuses and vertigo. Pt states she is a type 1 diabetic, hypertension, and also states taking meclizine PTA. Pt states she is no longer having the numbness and states the episode lasted about about 30 minutes. Pt denies any issues with ambulation just "haziness in my head". Pt has equal strengths bilaterally and is also A&OX4. Pt denies having issues with speech or comprehending what other people are saying. Pt states recent MRI of sinuses.

## 2020-05-27 NOTE — ED Provider Notes (Signed)
[facility] Emergency Department Provider Note    Event Date/Time   First MD Initiated Contact with Patient 05/27/20 [phone]     (approximate)  I have reviewed the triage vital signs and the nursing notes.   HISTORY  Chief Complaint  Numbness    HPI Crystal Cabrera is a 40 y.o. female with the below listed past medical history with several weeks of severe sinusitis symptoms being seen by ENT also having vertigo symptoms on meclizine presents to the ER for episode this morning when she woke up with feeling foggy and confused as well as having tingling sensation of the bridge of her nose going around her mouth and this is on both sides.  The symptoms lasted roughly 20 to 30 minutes.  No other associated weakness or discomfort no recent fevers.  Denies any numbness or tingling at this time.  She took a dose of meclizine when she woke up feeling foggy headed thinking that this is oncoming vertigo.    Past Medical History:  Diagnosis Date  . Diabetes mellitus without complication (HCC)   . Hypertension    Family History  Problem Relation Age of Onset  . Healthy Mother   . Healthy Father    Past Surgical History:  Procedure Laterality Date  . ANKLE SURGERY    . CESAREAN SECTION    . leg sugery  rt     There are no problems to display for this patient.     Prior to Admission medications   Medication Sig Start Date End Date Taking? Authorizing Provider  albuterol (VENTOLIN HFA) 108 (90 Base) MCG/ACT inhaler Inhale 2 puffs into the lungs every 4 (four) hours as needed. 04/19/20  Yes Becky Augusta, NP  amLODipine (NORVASC) 5 MG tablet Take 1 tablet (5 mg total) by mouth daily. 09/15/19  Yes Everlene Other G, DO  aspirin 81 MG chewable tablet Chew 81 mg by mouth daily.   Yes [provider]  atorvastatin (LIPITOR) 10 MG tablet Take 10 mg by mouth daily. 04/25/20  Yes [provider]  azelastine (ASTELIN) 0.1 % nasal spray SMARTSIG:1-2 Spray(s) Both  Nares Every 12 Hours PRN 05/18/20  Yes [provider]  carvedilol (COREG) 12.5 MG tablet Take 1 tablet (12.5 mg total) by mouth 2 (two) times daily with a meal. 09/15/19  Yes Cook, Jayce G, DO  cefdinir (OMNICEF) 300 MG capsule Take 300 mg by mouth 2 (two) times daily. 05/18/20  Yes [provider]  cloNIDine (CATAPRES) 0.1 MG tablet TAKE 1 TABLET (0.1 MG TOTAL) BY MOUTH 2 (TWO) TIMES DAILY 09/15/19  Yes Cook, Jayce G, DO  fluticasone (FLONASE) 50 MCG/ACT nasal spray Place 2 sprays into both nostrils daily. 05/18/20  Yes [provider]  ibuprofen (ADVIL) 200 MG tablet Take 400 mg by mouth every 6 (six) hours as needed.   Yes [provider]  insulin aspart (FIASP FLEXTOUCH) 100 UNIT/ML FlexTouch Pen Inject 34 Units into the skin with breakfast, with lunch, and with evening meal. 09/15/19  Yes Cook, Jayce G, DO  lisinopril (ZESTRIL) 10 MG tablet Take 1 tablet (10 mg total) by mouth daily. 09/15/19  Yes Cook, Jayce G, DO  meclizine (ANTIVERT) 25 MG tablet Take 25 mg by mouth 3 (three) times daily as needed. 05/13/20  Yes [provider]  TRESIBA FLEXTOUCH 200 UNIT/ML FlexTouch Pen Inject 70 Units into the skin daily. 03/25/20  Yes [provider]    Allergies Patient has no known allergies.  Social History Social History   Tobacco Use  . Smoking status: Never Smoker  . Smokeless tobacco: Never Used  Vaping Use  . Vaping Use: Never used  Substance Use Topics  . Alcohol use: Not Currently  . Drug use: Never    Review of Systems Patient denies headaches, rhinorrhea, blurry vision, numbness, shortness of breath, chest pain, edema, cough, abdominal pain, nausea, vomiting, diarrhea, dysuria, fevers, rashes or hallucinations unless otherwise stated above in HPI. ____________________________________________   PHYSICAL EXAM:  VITAL SIGNS: Vitals:   05/27/20 0948 05/27/20 1030  BP: 133/77 (!) 147/77  Pulse:  (!) 101  Resp: 15 (!) 21  Temp:     SpO2: 98% 100%    Constitutional: Alert and oriented.  Eyes: Conjunctivae are normal.  Head: Atraumatic. Nose: No congestion/rhinnorhea. Mouth/Throat: Mucous membranes are moist.   Neck: No stridor. Painless ROM.  Cardiovascular: Normal rate, regular rhythm. Grossly normal heart sounds.  Good peripheral circulation. Respiratory: Normal respiratory effort.  No retractions. Lungs CTAB. Gastrointestinal: Soft and nontender. No distention. No abdominal bruits. No CVA tenderness. Genitourinary:  Musculoskeletal: No lower extremity tenderness nor edema.  No joint effusions. Neurologic:  CN- intact.  No facial droop, Normal FNF.  Normal heel to shin.  Sensation intact bilaterally. Normal speech and language. No gross focal neurologic deficits are appreciated. No gait instability. Skin:  Skin is warm, dry and intact. No rash noted. Psychiatric: Mood and affect are normal. Speech and behavior are normal.  ____________________________________________   LABS (all labs ordered are listed, but only abnormal results are displayed)  Results for orders placed or performed during the hospital encounter of 05/27/20 (from the past 24 hour(s))  POC urine preg, ED     Status: None   Collection Time: 05/27/20  9:42 AM  Result Value Ref Range   Preg Test, Ur Negative Negative  Urinalysis, Routine w reflex microscopic Urine, Clean Catch     Status: Abnormal   Collection Time: 05/27/20  9:43 AM  Result Value Ref Range   Color, Urine YELLOW (A) YELLOW   APPearance HAZY (A) CLEAR   Specific Gravity, Urine 1.029 1.005 - 1.030   pH 5.0 5.0 - 8.0   Glucose, UA >=500 (A) NEGATIVE mg/dL   Hgb urine dipstick LARGE (A) NEGATIVE   Bilirubin Urine NEGATIVE NEGATIVE   Ketones, ur 20 (A) NEGATIVE mg/dL   Protein, ur NEGATIVE NEGATIVE mg/dL   Nitrite NEGATIVE NEGATIVE   Leukocytes,Ua NEGATIVE NEGATIVE   RBC / HPF >50 (H) 0 - 5 RBC/hpf   WBC, UA 0-5 0 - 5 WBC/hpf   Bacteria, UA NONE SEEN NONE SEEN   Squamous  Epithelial / LPF 0-5 0 - 5   Mucus PRESENT   CBC WITH DIFFERENTIAL     Status: None   Collection Time: 05/27/20  9:43 AM  Result Value Ref Range   WBC 9.0 4.0 - 10.5 K/uL   RBC 4.90 3.87 - 5.11 MIL/uL   Hemoglobin 13.9 12.0 - 15.0 g/dL   HCT 16.141.0 09.636.0 - 04.546.0 %   MCV 83.7 80.0 - 100.0 fL   MCH 28.4 26.0 - 34.0 pg   MCHC 33.9 30.0 - 36.0 g/dL   RDW 40.913.4 81.111.5 - 91.415.5 %   Platelets 292 150 - 400 K/uL   nRBC 0.0 0.0 - 0.2 %   Neutrophils Relative % 51 %   Neutro Abs 4.7 1.7 - 7.7 K/uL   Lymphocytes Relative 31 %   Lymphs Abs 2.8 0.7 - 4.0 K/uL  Monocytes Relative 10 %   Monocytes Absolute 0.9 0.1 - 1.0 K/uL   Eosinophils Relative 6 %   Eosinophils Absolute 0.5 0.0 - 0.5 K/uL   Basophils Relative 1 %   Basophils Absolute 0.1 0.0 - 0.1 K/uL   Immature Granulocytes 1 %   Abs Immature Granulocytes 0.05 0.00 - 0.07 K/uL  Comprehensive metabolic panel     Status: Abnormal   Collection Time: 05/27/20  9:43 AM  Result Value Ref Range   Sodium 133 (L) 135 - 145 mmol/L   Potassium 4.2 3.5 - 5.1 mmol/L   Chloride 95 (L) 98 - 111 mmol/L   CO2 25 22 - 32 mmol/L   Glucose, Bld 361 (H) 70 - 99 mg/dL   BUN 13 6 - 20 mg/dL   Creatinine, Ser 0.27 0.44 - 1.00 mg/dL   Calcium 9.1 8.9 - 74.1 mg/dL   Total Protein 7.2 6.5 - 8.1 g/dL   Albumin 3.8 3.5 - 5.0 g/dL   AST 39 15 - 41 U/L   ALT 45 (H) 0 - 44 U/L   Alkaline Phosphatase 55 38 - 126 U/L   Total Bilirubin 0.8 0.3 - 1.2 mg/dL   GFR, Estimated >28 >78 mL/min   Anion gap 13 5 - 15   ____________________________________________  EKG My review and personal interpretation at Time: 9:32   Indication: tingling  Rate: 99  Rhythm: sinus Axis: normal Other: normal intervals, no stemi ____________________________________________  RADIOLOGY  I personally reviewed all radiographic images ordered to evaluate for the above acute complaints and reviewed radiology reports and findings.  These findings were personally discussed with the patient.   Please see medical record for radiology report.  ____________________________________________   PROCEDURES  Procedure(s) performed:  Procedures    Critical Care performed: no ____________________________________________   INITIAL IMPRESSION / ASSESSMENT AND PLAN / ED COURSE  Pertinent labs & imaging results that were available during my care of the patient were reviewed by me and considered in my medical decision making (see chart for details).   DDX: sinusitis, paresthesia, tia, cva, mass  Crystal Cabrera is a 40 y.o. who presents to the ED with presentation as described above.  Patient well-appearing.  Seems to be very atypical for CVA given symptoms midline, no lateralizing features.  She just had MRI yesterday which was normal.  Low suspicion for TIA. May be worsening sinusitis.  Blood work will be sent for by differential will order CT imaging.  Given her duration of symptoms will also consider CT venogram.  Clinical Course as of 05/27/20 1210  Thu May 27, 2020  1206 CT imaging is reassuring.  No evidence of CV ST.  She had MRI done yesterday do not feel that additional neuroimaging indicated at this time.  Does not appear consistent with encephalitis meningitis not consistent with bleed.  Probable sinusitis possible complex migraine.  Patient stable and appropriate for outpatient follow-up. [PR]    Clinical Course User Index [PR] Willy Eddy, MD    The patient was evaluated in Emergency Department today for the symptoms described in the history of present illness. He/she was evaluated in the context of the global COVID-19 pandemic, which necessitated consideration that the patient might be at risk for infection with the SARS-CoV-2 virus that causes COVID-19. Institutional protocols and algorithms that pertain to the evaluation of patients at risk for COVID-19 are in a state of rapid change based on information released by regulatory bodies including the CDC and federal and  state organizations.  These policies and algorithms were followed during the patient's care in the ED.  As part of my medical decision making, I reviewed the following data within the electronic MEDICAL RECORD NUMBER Nursing notes reviewed and incorporated, Labs reviewed, notes from prior ED visits and [place] Controlled Substance Database   ____________________________________________   FINAL CLINICAL IMPRESSION(S) / ED DIAGNOSES  Final diagnoses:  Paresthesia  Nonintractable headache, unspecified chronicity pattern, unspecified headache type      NEW MEDICATIONS STARTED DURING THIS VISIT:  New Prescriptions   No medications on file     Note:  This document was prepared using Dragon voice recognition software and may include unintentional dictation errors.    Willy Eddy, MD 05/27/20 1210

## 2020-06-03 ENCOUNTER — Other Ambulatory Visit: Payer: Self-pay

## 2020-06-03 ENCOUNTER — Encounter: Payer: Self-pay | Admitting: Emergency Medicine

## 2020-06-03 ENCOUNTER — Ambulatory Visit
Admission: EM | Admit: 2020-06-03 | Discharge: 2020-06-03 | Disposition: A | Discharge status: Home / Self Care | Payer: BC Managed Care – PPO | Attending: Sports Medicine | Admitting: Sports Medicine

## 2020-06-03 DIAGNOSIS — J328 Other chronic sinusitis: Secondary | ICD-10-CM | POA: Insufficient documentation

## 2020-06-03 DIAGNOSIS — R519 Headache, unspecified: Secondary | ICD-10-CM | POA: Insufficient documentation

## 2020-06-03 DIAGNOSIS — Z7982 Long term (current) use of aspirin: Secondary | ICD-10-CM | POA: Diagnosis not present

## 2020-06-03 DIAGNOSIS — Z79899 Other long term (current) drug therapy: Secondary | ICD-10-CM | POA: Insufficient documentation

## 2020-06-03 DIAGNOSIS — R0981 Nasal congestion: Secondary | ICD-10-CM | POA: Diagnosis not present

## 2020-06-03 DIAGNOSIS — Z794 Long term (current) use of insulin: Secondary | ICD-10-CM | POA: Diagnosis not present

## 2020-06-03 DIAGNOSIS — Z20822 Contact with and (suspected) exposure to covid-19: Secondary | ICD-10-CM | POA: Diagnosis not present

## 2020-06-03 DIAGNOSIS — R590 Localized enlarged lymph nodes: Secondary | ICD-10-CM | POA: Insufficient documentation

## 2020-06-03 LAB — SARS CORONAVIRUS 2 (TAT 6-24 HRS): SARS Coronavirus 2: NEGATIVE

## 2020-06-03 MED ORDER — LEVOFLOXACIN 500 MG PO TABS: 500.0000 mg | ORAL_TABLET | Freq: Every day | ORAL | 0 refills | Status: DC

## 2020-06-03 NOTE — ED Provider Notes (Signed)
[facility]    CSN: [id number] Arrival date & time: 06/03/20  1151      History   Chief Complaint Chief Complaint  Patient presents with  . Nasal Congestion  . Headache    HPI Crystal Cabrera is a 40 y.o. female.   Patient pleasant 40 year old female who presents for evaluation of the above issues.  She reports a long protracted course for several months of upper respiratory but mostly sinus type symptoms.  She has been seen by ENT and was placed on steroids and antibiotics.  The steroids did increase her blood sugars.  She reports for the last few days she has been having a lot of postnasal drip, rhinorrhea, and cough.  She is also noted a lot of sinus pressure facial pain and headaches.  She thinks her glands are swollen.  She denies any fever shakes chills.  No nausea vomiting but some mild diarrhea.  She has been vaccinated against COVID and has received a booster.  She reports no Covid exposure and no history of Covid.  No chest pain or shortness of breath.  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  She reportedly had a scan done by ENT that showed some sinus involvement and surgery was recommended.  She has a follow-up with ENT on March 7.  She comes in today trying to get some treatment until she can see ENT.  No red flag signs or symptoms elicited on history.     Past Medical History:  Diagnosis Date  . Diabetes mellitus without complication (HCC)   . Hypertension     There are no problems to display for this patient.   Past Surgical History:  Procedure Laterality Date  . ANKLE SURGERY    . CESAREAN SECTION    . leg sugery  rt      OB History   No obstetric history on file.      Home Medications    Prior to Admission medications   Medication Sig Start Date End Date Taking? Authorizing Provider  albuterol (VENTOLIN HFA) 108 (90 Base) MCG/ACT inhaler Inhale 2 puffs into  the lungs every 4 (four) hours as needed. 04/19/20  Yes Becky Augusta, NP  amLODipine (NORVASC) 5 MG tablet Take 1 tablet (5 mg total) by mouth daily. 09/15/19  Yes Everlene Other G, DO  aspirin 81 MG chewable tablet Chew 81 mg by mouth daily.   Yes [provider]  atorvastatin (LIPITOR) 10 MG tablet Take 10 mg by mouth daily. 04/25/20  Yes [provider]  azelastine (ASTELIN) 0.1 % nasal spray SMARTSIG:1-2 Spray(s) Both Nares Every 12 Hours PRN 05/18/20  Yes [provider]  carvedilol (COREG) 12.5 MG tablet Take 1 tablet (12.5 mg total) by mouth 2 (two) times daily with a meal. 09/15/19  Yes Cook, Jayce G, DO  cloNIDine (CATAPRES) 0.1 MG tablet TAKE 1 TABLET (0.1 MG TOTAL) BY MOUTH 2 (TWO) TIMES DAILY 09/15/19  Yes Cook, Jayce G, DO  fluticasone (FLONASE) 50 MCG/ACT nasal spray Place 2 sprays into both nostrils daily. 05/18/20  Yes [provider]  ibuprofen (ADVIL) 200 MG tablet Take 400 mg by mouth every 6 (six) hours as needed.   Yes [provider]  insulin aspart (FIASP FLEXTOUCH) 100 UNIT/ML FlexTouch Pen Inject 34 Units into the skin with breakfast, with lunch, and with evening meal. 09/15/19  Yes  Cook, Jayce G, DO  levofloxacin (LEVAQUIN) 500 MG tablet Take 1 tablet (500 mg total) by mouth daily. 06/03/20  Yes Delton See, MD  lisinopril (ZESTRIL) 10 MG tablet Take 1 tablet (10 mg total) by mouth daily. 09/15/19  Yes Cook, Jayce G, DO  meclizine (ANTIVERT) 25 MG tablet Take 25 mg by mouth 3 (three) times daily as needed. 05/13/20  Yes [provider]  TRESIBA FLEXTOUCH 200 UNIT/ML FlexTouch Pen Inject 70 Units into the skin daily. 03/25/20  Yes [provider]  cefdinir (OMNICEF) 300 MG capsule Take 300 mg by mouth 2 (two) times daily. 05/18/20   [provider]    Family History Family History  Problem Relation Age of Onset  . Healthy Mother   . Healthy Father     Social History Social History   Tobacco Use  . Smoking status:  Never Smoker  . Smokeless tobacco: Never Used  Vaping Use  . Vaping Use: Never used  Substance Use Topics  . Alcohol use: Not Currently  . Drug use: Never     Allergies   Patient has no known allergies.   Review of Systems Review of Systems  Constitutional: Positive for fatigue. Negative for activity change, appetite change and chills.  HENT: Positive for congestion, postnasal drip, rhinorrhea, sinus pressure, sinus pain and sore throat. Negative for ear discharge, ear pain and facial swelling.   Eyes: Negative.   Respiratory: Negative for cough, shortness of breath, wheezing and stridor.   Cardiovascular: Negative for chest pain and palpitations.  Gastrointestinal: Negative for abdominal distention, abdominal pain, constipation, diarrhea, nausea and vomiting.  Genitourinary: Negative.   Musculoskeletal: Negative.   Skin: Negative.   Neurological: Positive for headaches. Negative for dizziness, tremors, seizures, syncope, speech difficulty, weakness and numbness.  All other systems reviewed and are negative.    Physical Exam Triage Vital Signs ED Triage Vitals  Enc Vitals Group     BP 06/03/20 1216 (!) 142/91     Pulse Rate 06/03/20 1216 (!) 113     Resp 06/03/20 1216 18     Temp 06/03/20 1216 98.1 F (36.7 C)     Temp Source 06/03/20 1216 Oral     SpO2 06/03/20 1216 99 %     Weight 06/03/20 1214 210 lb 1.6 oz (95.3 kg)     Height 06/03/20 1214 5\' 5"  (1.651 m)     Head Circumference --      Peak Flow --      Pain Score 06/03/20 1213 8     Pain Loc --      Pain Edu? --      Excl. in GC? --    No data found.  Updated Vital Signs BP (!) 142/91 (BP Location: Left Arm)   Pulse (!) 113   Temp 98.1 F (36.7 C) (Oral)   Resp 18   Ht 5\' 5"  (1.651 m)   Wt 95.3 kg   LMP 05/23/2020 (Exact Date) Comment: neg upt  SpO2 99%   BMI 34.96 kg/m   Visual Acuity Right Eye Distance:   Left Eye Distance:   Bilateral Distance:    Right Eye Near:   Left Eye Near:     Bilateral Near:     Physical Exam Vitals and nursing note reviewed.  Constitutional:      General: She is not in acute distress.    Appearance: She is well-developed. She is not ill-appearing or toxic-appearing.     Comments: Patient is frustrated and  uncomfortable.  HENT:     Head: Normocephalic and atraumatic.     Right Ear: Tympanic membrane normal.     Left Ear: Tympanic membrane normal.     Nose: Congestion present. No rhinorrhea.     Mouth/Throat:     Mouth: Mucous membranes are moist.     Pharynx: Oropharynx is clear. No oropharyngeal exudate or posterior oropharyngeal erythema.  Eyes:     Extraocular Movements: Extraocular movements intact.     Conjunctiva/sclera: Conjunctivae normal.     Pupils: Pupils are equal, round, and reactive to light.  Cardiovascular:     Rate and Rhythm: Normal rate and regular rhythm.     Heart sounds: Normal heart sounds. No murmur heard. No friction rub. No gallop.   Pulmonary:     Effort: Pulmonary effort is normal. No respiratory distress.     Breath sounds: Normal breath sounds. No stridor. No wheezing, rhonchi or rales.  Abdominal:     General: Bowel sounds are normal.     Palpations: Abdomen is soft.  Musculoskeletal:        General: No swelling or tenderness. Normal range of motion.     Cervical back: Normal range of motion and neck supple. No rigidity.  Lymphadenopathy:     Cervical: Cervical adenopathy present.  Skin:    General: Skin is warm and dry.     Capillary Refill: Capillary refill takes less than 2 seconds.  Neurological:     Mental Status: She is alert and oriented to person, place, and time.     GCS: GCS eye subscore is 4. GCS verbal subscore is 5. GCS motor subscore is 6.     Cranial Nerves: No cranial nerve deficit.     Sensory: No sensory deficit.      UC Treatments / Results  Labs (all labs ordered are listed, but only abnormal results are displayed) Labs Reviewed  SARS CORONAVIRUS 2 (TAT 6-24 HRS)     EKG   Radiology No results found.  Procedures Procedures (including critical care time)  Medications Ordered in UC Medications - No data to display  Initial Impression / Assessment and Plan / UC Course  I have reviewed the triage vital signs and the nursing notes.  Pertinent labs & imaging results that were available during my care of the patient were reviewed by me and considered in my medical decision making (see chart for details).  Clinical impression: Persistent sinusitis that is chronic in nature with associated facial pain and acute nonintractable headache, nasal congestion and anterior cervical lymphadenopathy.  Treatment plan: 1.  The findings and treatment plan were discussed in detail with the patient.  Patient was in agreement. 2.  I went ahead and started on Levaquin 500 mg daily for 10 days until she can see ENT. 3.  I advised her to continue with the nasal sprays and the treatment ENT is initiated. 4.  We went ahead and tested her for Covid just to ensure that she does not have a COVID-19 infection.  That was pending at the time of discharge.  I did indicate someone will call her if she was positive.  I have asked her to follow along in my chart. 5.  Educational handouts were provided. 6.  Gave her a work note. 7.  Supportive care, over-the-counter meds as needed. 8.  Advised her to go ahead and follow-up with ENT as scheduled.  She says that she is calling every day to see if there is a cancellation  and try to move up her appointment time.  She is anxious to proceed with surgery of is going to make her feel better. 9.  Follow-up here as needed.    Final Clinical Impressions(s) / UC Diagnoses   Final diagnoses:  Other chronic sinusitis  Facial pain  Acute nonintractable headache, unspecified headache type  Anterior cervical lymphadenopathy  Nasal congestion     Discharge Instructions     Please take the antibiotic as prescribed. Please continue with  the medications and nasal sprays that ENT gave you. Your Covid test was pending and someone will contact you if it is positive.  Please follow along and MyChart. I provided you educational handouts as well as a work note. Please follow-up with ENT as scheduled.  I hope you get to feeling better. Dr. Zachery Dauer    ED Prescriptions    Medication Sig Dispense Auth. Provider   levofloxacin (LEVAQUIN) 500 MG tablet Take 1 tablet (500 mg total) by mouth daily. 10 tablet Delton See, MD     PDMP not reviewed this encounter.   Delton See, MD 06/03/20 2227

## 2020-06-03 NOTE — ED Triage Notes (Signed)
Pt c/o nasal congestion, headache, cough, cervical lymph node swelling. Started this morning. She states she has been having sinus issues for the last 2 months and has been to the ED and ENT for this.

## 2020-06-03 NOTE — Discharge Instructions (Signed)
Please take the antibiotic as prescribed. Please continue with the medications and nasal sprays that ENT gave you. Your Covid test was pending and someone will contact you if it is positive.  Please follow along and MyChart. I provided you educational handouts as well as a work note. Please follow-up with ENT as scheduled.  I hope you get to feeling better. Dr. Zachery Dauer

## 2020-06-17 ENCOUNTER — Other Ambulatory Visit: Payer: Self-pay

## 2020-06-17 ENCOUNTER — Encounter: Payer: Self-pay | Admitting: Otolaryngology

## 2020-06-21 ENCOUNTER — Other Ambulatory Visit
Admission: RE | Admit: 2020-06-21 | Discharge: 2020-06-21 | Disposition: A | Discharge status: Home / Self Care | Payer: BC Managed Care – PPO | Source: Ambulatory Visit | Attending: Otolaryngology | Admitting: Otolaryngology

## 2020-06-21 ENCOUNTER — Other Ambulatory Visit: Payer: Self-pay

## 2020-06-21 DIAGNOSIS — Z01812 Encounter for preprocedural laboratory examination: Secondary | ICD-10-CM | POA: Insufficient documentation

## 2020-06-21 DIAGNOSIS — J320 Chronic maxillary sinusitis: Secondary | ICD-10-CM | POA: Diagnosis not present

## 2020-06-21 DIAGNOSIS — J339 Nasal polyp, unspecified: Secondary | ICD-10-CM | POA: Diagnosis not present

## 2020-06-21 DIAGNOSIS — J3489 Other specified disorders of nose and nasal sinuses: Secondary | ICD-10-CM | POA: Diagnosis not present

## 2020-06-21 DIAGNOSIS — J322 Chronic ethmoidal sinusitis: Secondary | ICD-10-CM | POA: Diagnosis not present

## 2020-06-21 DIAGNOSIS — H6980 Other specified disorders of Eustachian tube, unspecified ear: Secondary | ICD-10-CM | POA: Diagnosis not present

## 2020-06-21 DIAGNOSIS — J342 Deviated nasal septum: Secondary | ICD-10-CM | POA: Diagnosis present

## 2020-06-21 DIAGNOSIS — J323 Chronic sphenoidal sinusitis: Secondary | ICD-10-CM | POA: Diagnosis not present

## 2020-06-21 DIAGNOSIS — Z20822 Contact with and (suspected) exposure to covid-19: Secondary | ICD-10-CM | POA: Diagnosis not present

## 2020-06-21 DIAGNOSIS — Z794 Long term (current) use of insulin: Secondary | ICD-10-CM | POA: Diagnosis not present

## 2020-06-21 DIAGNOSIS — Z79899 Other long term (current) drug therapy: Secondary | ICD-10-CM | POA: Diagnosis not present

## 2020-06-21 DIAGNOSIS — J343 Hypertrophy of nasal turbinates: Secondary | ICD-10-CM | POA: Diagnosis not present

## 2020-06-21 LAB — SARS CORONAVIRUS 2 (TAT 6-24 HRS): SARS Coronavirus 2: NEGATIVE

## 2020-06-23 ENCOUNTER — Ambulatory Visit: Payer: BC Managed Care – PPO | Admitting: Anesthesiology

## 2020-06-23 ENCOUNTER — Ambulatory Visit
Admission: RE | Admit: 2020-06-23 | Discharge: 2020-06-23 | Disposition: A | Discharge status: Home / Self Care | Payer: BC Managed Care – PPO | Attending: Otolaryngology | Admitting: Otolaryngology

## 2020-06-23 ENCOUNTER — Encounter: Payer: Self-pay | Admitting: Otolaryngology

## 2020-06-23 ENCOUNTER — Other Ambulatory Visit: Payer: Self-pay

## 2020-06-23 ENCOUNTER — Encounter
Admission: RE | Disposition: A | Discharge status: Home / Self Care | Payer: Self-pay | Source: Home / Self Care | Attending: Otolaryngology

## 2020-06-23 DIAGNOSIS — J342 Deviated nasal septum: Secondary | ICD-10-CM | POA: Insufficient documentation

## 2020-06-23 DIAGNOSIS — J339 Nasal polyp, unspecified: Secondary | ICD-10-CM | POA: Insufficient documentation

## 2020-06-23 DIAGNOSIS — Z794 Long term (current) use of insulin: Secondary | ICD-10-CM | POA: Insufficient documentation

## 2020-06-23 DIAGNOSIS — Z79899 Other long term (current) drug therapy: Secondary | ICD-10-CM | POA: Insufficient documentation

## 2020-06-23 DIAGNOSIS — J320 Chronic maxillary sinusitis: Secondary | ICD-10-CM | POA: Insufficient documentation

## 2020-06-23 DIAGNOSIS — J3489 Other specified disorders of nose and nasal sinuses: Secondary | ICD-10-CM | POA: Insufficient documentation

## 2020-06-23 DIAGNOSIS — Z20822 Contact with and (suspected) exposure to covid-19: Secondary | ICD-10-CM | POA: Insufficient documentation

## 2020-06-23 DIAGNOSIS — H6980 Other specified disorders of Eustachian tube, unspecified ear: Secondary | ICD-10-CM | POA: Insufficient documentation

## 2020-06-23 DIAGNOSIS — J343 Hypertrophy of nasal turbinates: Secondary | ICD-10-CM | POA: Insufficient documentation

## 2020-06-23 DIAGNOSIS — J323 Chronic sphenoidal sinusitis: Secondary | ICD-10-CM | POA: Insufficient documentation

## 2020-06-23 DIAGNOSIS — J322 Chronic ethmoidal sinusitis: Secondary | ICD-10-CM | POA: Insufficient documentation

## 2020-06-23 HISTORY — PX: NASAL SEPTOPLASTY W/ TURBINOPLASTY: SHX2070

## 2020-06-23 HISTORY — PX: MYRINGOTOMY WITH TUBE PLACEMENT: SHX5663

## 2020-06-23 HISTORY — DX: Scoliosis, unspecified: M41.9

## 2020-06-23 HISTORY — PX: IMAGE GUIDED SINUS SURGERY: SHX6570

## 2020-06-23 HISTORY — DX: Tachycardia, unspecified: R00.0

## 2020-06-23 HISTORY — PX: ETHMOIDECTOMY: SHX5197

## 2020-06-23 HISTORY — PX: SPHENOIDECTOMY: SHX2421

## 2020-06-23 HISTORY — PX: MAXILLARY ANTROSTOMY: SHX2003

## 2020-06-23 HISTORY — DX: Dizziness and giddiness: R42

## 2020-06-23 HISTORY — DX: Headache, unspecified: R51.9

## 2020-06-23 LAB — GLUCOSE, CAPILLARY
Glucose-Capillary: 174 mg/dL — ABNORMAL HIGH (ref 70–99)
Glucose-Capillary: 302 mg/dL — ABNORMAL HIGH (ref 70–99)
Glucose-Capillary: 349 mg/dL — ABNORMAL HIGH (ref 70–99)

## 2020-06-23 LAB — POCT PREGNANCY, URINE: Preg Test, Ur: NEGATIVE

## 2020-06-23 SURGERY — SINUS SURGERY, WITH IMAGING GUIDANCE
Anesthesia: General | Site: Nose

## 2020-06-23 MED ORDER — OXYMETAZOLINE HCL 0.05 % NA SOLN
NASAL | Status: DC | PRN
  Administered 2020-06-23: 1 via TOPICAL

## 2020-06-23 MED ORDER — AMOXICILLIN-POT CLAVULANATE 875-125 MG PO TABS: 1.0000 | ORAL_TABLET | Freq: Two times a day (BID) | ORAL | 0 refills | Status: DC

## 2020-06-23 MED ORDER — ONDANSETRON HCL 4 MG PO TABS: 4.0000 mg | ORAL_TABLET | Freq: Three times a day (TID) | ORAL | 0 refills | Status: DC | PRN

## 2020-06-23 MED ORDER — CIPROFLOXACIN-DEXAMETHASONE 0.3-0.1 % OT SUSP
OTIC | Status: DC | PRN
  Administered 2020-06-23: 4 [drp] via OTIC

## 2020-06-23 MED ORDER — INSULIN LISPRO 100 UNIT/ML ~~LOC~~ SOLN
5.0000 [IU] | Freq: Once | SUBCUTANEOUS | Status: AC
  Administered 2020-06-23: 5 [IU] via SUBCUTANEOUS

## 2020-06-23 MED ORDER — PHENYLEPHRINE HCL (PRESSORS) 10 MG/ML IV SOLN
INTRAVENOUS | Status: DC | PRN
  Administered 2020-06-23 (×5): 50 ug via INTRAVENOUS
  Administered 2020-06-23: 100 ug via INTRAVENOUS
  Administered 2020-06-23: 50 ug via INTRAVENOUS
  Administered 2020-06-23: 100 ug via INTRAVENOUS

## 2020-06-23 MED ORDER — GLYCOPYRROLATE 0.2 MG/ML IJ SOLN
INTRAMUSCULAR | Status: DC | PRN
  Administered 2020-06-23: .1 mg via INTRAVENOUS

## 2020-06-23 MED ORDER — OXYCODONE HCL 5 MG/5ML PO SOLN
5.0000 mg | Freq: Once | ORAL | Status: AC | PRN
Start: 2020-06-23 — End: 2020-06-23
  Administered 2020-06-23: 5 mg via ORAL

## 2020-06-23 MED ORDER — SCOPOLAMINE 1 MG/3DAYS TD PT72
1.0000 | MEDICATED_PATCH | Freq: Once | TRANSDERMAL | Status: DC
  Administered 2020-06-23: 1.5 mg via TRANSDERMAL

## 2020-06-23 MED ORDER — LIDOCAINE HCL (CARDIAC) PF 100 MG/5ML IV SOSY
PREFILLED_SYRINGE | INTRAVENOUS | Status: DC | PRN
  Administered 2020-06-23: 40 mg via INTRAVENOUS

## 2020-06-23 MED ORDER — LIDOCAINE HCL 4 % MT SOLN
OROMUCOSAL | Status: DC | PRN
  Administered 2020-06-23: 4 mL via TOPICAL

## 2020-06-23 MED ORDER — BACITRACIN 500 UNIT/GM EX OINT
TOPICAL_OINTMENT | CUTANEOUS | Status: DC | PRN
  Administered 2020-06-23: 1 via TOPICAL

## 2020-06-23 MED ORDER — LIDOCAINE-EPINEPHRINE 1 %-1:100000 IJ SOLN
INTRAMUSCULAR | Status: DC | PRN
  Administered 2020-06-23: 7 mL

## 2020-06-23 MED ORDER — ONDANSETRON HCL 4 MG/2ML IJ SOLN
INTRAMUSCULAR | Status: DC | PRN
  Administered 2020-06-23: 4 mg via INTRAVENOUS

## 2020-06-23 MED ORDER — OXYCODONE-ACETAMINOPHEN 5-325 MG PO TABS: 1.0000 | ORAL_TABLET | ORAL | 0 refills | Status: DC | PRN

## 2020-06-23 MED ORDER — EPHEDRINE SULFATE 50 MG/ML IJ SOLN
INTRAMUSCULAR | Status: DC | PRN
  Administered 2020-06-23: 10 mg via INTRAVENOUS
  Administered 2020-06-23 (×4): 5 mg via INTRAVENOUS

## 2020-06-23 MED ORDER — ONDANSETRON 4 MG PO TBDP
4.0000 mg | ORAL_TABLET | Freq: Once | ORAL | Status: AC
  Administered 2020-06-23: 4 mg via ORAL

## 2020-06-23 MED ORDER — OXYCODONE HCL 5 MG PO TABS: 5.0000 mg | ORAL_TABLET | Freq: Once | ORAL | Status: AC | PRN

## 2020-06-23 MED ORDER — PREDNISONE 10 MG (21) PO TBPK
ORAL_TABLET | ORAL | 0 refills | Status: DC
Start: 2020-06-23 — End: 2020-09-25

## 2020-06-23 MED ORDER — LABETALOL HCL 5 MG/ML IV SOLN
INTRAVENOUS | Status: DC | PRN
  Administered 2020-06-23: 5 mg via INTRAVENOUS

## 2020-06-23 MED ORDER — SUCCINYLCHOLINE CHLORIDE 20 MG/ML IJ SOLN
INTRAMUSCULAR | Status: DC | PRN
  Administered 2020-06-23: 100 mg via INTRAVENOUS

## 2020-06-23 MED ORDER — INSULIN ASPART 100 UNIT/ML ~~LOC~~ SOLN: 5.0000 [IU] | Freq: Once | SUBCUTANEOUS | Status: DC

## 2020-06-23 MED ORDER — MIDAZOLAM HCL 5 MG/5ML IJ SOLN
INTRAMUSCULAR | Status: DC | PRN
  Administered 2020-06-23: 2 mg via INTRAVENOUS

## 2020-06-23 MED ORDER — FENTANYL CITRATE (PF) 100 MCG/2ML IJ SOLN
25.0000 ug | INTRAMUSCULAR | Status: DC | PRN
Start: 2020-06-23 — End: 2020-06-23
  Administered 2020-06-23: 25 ug via INTRAVENOUS
  Administered 2020-06-23: 50 ug via INTRAVENOUS

## 2020-06-23 MED ORDER — DEXAMETHASONE SODIUM PHOSPHATE 4 MG/ML IJ SOLN
INTRAMUSCULAR | Status: DC | PRN
  Administered 2020-06-23: 10 mg via INTRAVENOUS

## 2020-06-23 MED ORDER — LACTATED RINGERS IV SOLN: INTRAVENOUS | Status: DC

## 2020-06-23 MED ORDER — ACETAMINOPHEN 10 MG/ML IV SOLN
1000.0000 mg | Freq: Once | INTRAVENOUS | Status: AC
  Administered 2020-06-23: 1000 mg via INTRAVENOUS

## 2020-06-23 MED ORDER — PROPOFOL 10 MG/ML IV BOLUS
INTRAVENOUS | Status: DC | PRN
  Administered 2020-06-23: 200 mg via INTRAVENOUS

## 2020-06-23 MED ORDER — FENTANYL CITRATE (PF) 100 MCG/2ML IJ SOLN
INTRAMUSCULAR | Status: DC | PRN
  Administered 2020-06-23 (×2): 50 ug via INTRAVENOUS
  Administered 2020-06-23 (×5): 25 ug via INTRAVENOUS

## 2020-06-23 MED ORDER — DEXMEDETOMIDINE HCL 200 MCG/2ML IV SOLN
INTRAVENOUS | Status: DC | PRN
  Administered 2020-06-23: 10 ug via INTRAVENOUS

## 2020-06-23 SURGICAL SUPPLY — 49 items
BALL CTTN LRG ABS STRL LF (GAUZE/BANDAGES/DRESSINGS) ×3
BALLN SINUPLASTY KIT 6X16 (BALLOONS) ×4
BALLOON SINUPLASTY KIT 6X16 (BALLOONS) IMPLANT
BATTERY INSTRU NAVIGATION (MISCELLANEOUS) ×12 IMPLANT
BLADE MYR LANCE NRW W/HDL (BLADE) ×4 IMPLANT
BTRY SRG DRVR LF (MISCELLANEOUS) ×9
CANISTER SUCT 1200ML W/VALVE (MISCELLANEOUS) ×4 IMPLANT
COAG SUCT 10F 3.5MM HAND CTRL (MISCELLANEOUS) ×4 IMPLANT
COTTONBALL LRG STERILE PKG (GAUZE/BANDAGES/DRESSINGS) ×4 IMPLANT
DEVICE INFLATION SEID (MISCELLANEOUS) ×1 IMPLANT
DRESSING NASL FOAM PST OP SINU (MISCELLANEOUS) IMPLANT
DRSG NASAL FOAM POST OP SINU (MISCELLANEOUS) ×8
DRSG TEGADERM 4X4.75 (GAUZE/BANDAGES/DRESSINGS) ×1 IMPLANT
ELECT REM PT RETURN 9FT ADLT (ELECTROSURGICAL) ×4
ELECTRODE REM PT RTRN 9FT ADLT (ELECTROSURGICAL) ×3 IMPLANT
GLOVE SURG ENC MOIS LTX SZ7.5 (GLOVE) ×8 IMPLANT
GOWN STRL REUS W/ TWL LRG LVL3 (GOWN DISPOSABLE) ×3 IMPLANT
GOWN STRL REUS W/TWL LRG LVL3 (GOWN DISPOSABLE) ×4
IV NS 500ML (IV SOLUTION) ×4
IV NS 500ML BAXH (IV SOLUTION) ×3 IMPLANT
KIT TURNOVER KIT A (KITS) ×4 IMPLANT
NDL HYPO 25GX1X1/2 BEV (NEEDLE) ×3 IMPLANT
NEEDLE HYPO 25GX1X1/2 BEV (NEEDLE) ×4 IMPLANT
NS IRRIG 500ML POUR BTL (IV SOLUTION) ×4 IMPLANT
PACK ENT CUSTOM (PACKS) ×4 IMPLANT
PACKING NASAL EPIS 4X2.4 XEROG (MISCELLANEOUS) ×2 IMPLANT
PATTIES SURGICAL .5 X3 (DISPOSABLE) ×5 IMPLANT
SHAVER DIEGO BLD STD TYPE A (BLADE) ×4 IMPLANT
SOL ANTI-FOG 6CC FOG-OUT (MISCELLANEOUS) ×3 IMPLANT
SOL FOG-OUT ANTI-FOG 6CC (MISCELLANEOUS) ×1
SPLINT NASAL SEPTAL BLV .50 ST (MISCELLANEOUS) ×4 IMPLANT
STRAP BODY AND KNEE 60X3 (MISCELLANEOUS) ×4 IMPLANT
SUT CHROMIC 4 0 RB 1X27 (SUTURE) ×4 IMPLANT
SUT ETHILON 2 0 FSLX (SUTURE) ×1 IMPLANT
SUT ETHILON 3-0 FS-10 30 BLK (SUTURE) ×4
SUT ETHILON 4-0 (SUTURE)
SUT ETHILON 4-0 FS2 18XMFL BLK (SUTURE)
SUT PLAIN GUT 4-0 (SUTURE) ×3 IMPLANT
SUTURE EHLN 3-0 FS-10 30 BLK (SUTURE) ×3 IMPLANT
SUTURE ETHLN 4-0 FS2 18XMF BLK (SUTURE) IMPLANT
SYR 10ML LL (SYRINGE) ×4 IMPLANT
SYR EAR/ULCER 2OZ (SYRINGE) ×4 IMPLANT
TOWEL OR 17X26 4PK STRL BLUE (TOWEL DISPOSABLE) ×4 IMPLANT
TRACKER CRANIALMASK (MASK) ×4 IMPLANT
TUBE EAR ARMSTRONG HC 1.14X3.5 (OTOLOGIC RELATED) ×8 IMPLANT
TUBING CONN 6MMX3.1M (TUBING) ×1
TUBING DECLOG MULTIDEBRIDER (TUBING) ×4 IMPLANT
TUBING SUCTION CONN 0.25 STRL (TUBING) ×3 IMPLANT
WATER STERILE IRR 250ML POUR (IV SOLUTION) ×1 IMPLANT

## 2020-06-23 NOTE — Transfer of Care (Signed)
Immediate Anesthesia Transfer of Care Note  Patient: Crystal Cabrera  Procedure(s) Performed: IMAGE GUIDED SINUS SURGERY (N/A Nose) NASAL SEPTOPLASTY WITH TURBINATE REDUCTION BALLOON (Bilateral Nose) ETHMOIDECTOMY (Bilateral Nose) SPHENOIDECTOMY (Bilateral Nose) MAXILLARY ANTROSTOMY with tissue (Bilateral Nose) MYRINGOTOMY WITH TUBE PLACEMENT (Bilateral Ear)  Patient Location: PACU  Anesthesia Type: General  Level of Consciousness: awake, alert  and patient cooperative  Airway and Oxygen Therapy: Patient Spontanous Breathing and Patient connected to supplemental oxygen  Post-op Assessment: Post-op Vital signs reviewed, Patient's Cardiovascular Status Stable, Respiratory Function Stable, Patent Airway and No signs of Nausea or vomiting  Post-op Vital Signs: Reviewed and stable  Complications: No complications documented.

## 2020-06-23 NOTE — Anesthesia Preprocedure Evaluation (Addendum)
Anesthesia Evaluation  Patient identified by MRN, date of birth, ID band Patient awake    Reviewed: Allergy & Precautions, NPO status   Airway Mallampati: II  TM Distance: >3 FB     Dental   Pulmonary    breath sounds clear to auscultation       Cardiovascular hypertension,  Rhythm:Regular Rate:Normal  HLD   Neuro/Psych  Headaches,    GI/Hepatic   Endo/Other  diabetes, Type 1, Insulin DependentObesity - BMI 37  Renal/GU      Musculoskeletal   Abdominal   Peds  Hematology   Anesthesia Other Findings   Reproductive/Obstetrics                            Anesthesia Physical Anesthesia Plan  ASA: III  Anesthesia Plan: General   Post-op Pain Management:    Induction: Intravenous  PONV Risk Score and Plan: Ondansetron, Dexamethasone, Midazolam and Treatment may vary due to age or medical condition  Airway Management Planned: Oral ETT  Additional Equipment:   Intra-op Plan:   Post-operative Plan:   Informed Consent: I have reviewed the patients History and Physical, chart, labs and discussed the procedure including the risks, benefits and alternatives for the proposed anesthesia with the patient or authorized representative who has indicated his/her understanding and acceptance.     Dental advisory given  Plan Discussed with: CRNA  Anesthesia Plan Comments:         Anesthesia Quick Evaluation

## 2020-06-23 NOTE — Discharge Instructions (Signed)
[facility] ENDOSCOPIC SINUS SURGERY [facility]  What is Functional Endoscopic Sinus Surgery?  The Surgery involves making the natural openings of the sinuses larger by removing the bony partitions that separate the sinuses from the nasal cavity.  The natural sinus lining is preserved as much as possible to allow the sinuses to resume normal function after the surgery.  In some patients nasal polyps (excessively swollen lining of the sinuses) may be removed to relieve obstruction of the sinus openings.  The surgery is performed through the nose using lighted scopes, which eliminates the need for incisions on the face.  A septoplasty is a different procedure which is sometimes performed with sinus surgery.  It involves straightening the boy partition that separates the two sides of your nose.  A crooked or deviated septum may need repair if is obstructing the sinuses or nasal airflow.  Turbinate reduction is also often performed during sinus surgery.  The turbinates are bony proturberances from the side walls of the nose which swell and can obstruct the nose in patients with sinus and allergy problems.  Their size can be surgically reduced to help relieve nasal obstruction.  What Can Sinus Surgery Do For Me?  Sinus surgery can reduce the frequency of sinus infections requiring antibiotic treatment.  This can provide improvement in nasal congestion, post-nasal drainage, facial pressure and nasal obstruction.  Surgery will NOT prevent you from ever having an infection again, so it usually only for patients who get infections 4 or more times yearly requiring antibiotics, or for infections that do not clear with antibiotics.  It will not cure nasal allergies, so patients with allergies may still require medication to treat their allergies after surgery. Surgery may improve headaches related to sinusitis, however, some people will continue to  require medication to control sinus headaches related to allergies.  Surgery will do nothing for other forms of headache (migraine, tension or cluster).  What Are the Risks of Endoscopic Sinus Surgery?  Current techniques allow surgery to be performed safely with little risk, however, there are rare complications that patients should be aware of.  Because the sinuses are located around the eyes, there is risk of eye injury, including blindness, though again, this would be quite rare. This is usually a result of bleeding behind the eye during surgery, which puts the vision oat risk, though there are treatments to protect the vision and prevent permanent disrupted by surgery causing a leak of the spinal fluid that surrounds the brain.  More serious complications would include bleeding inside the brain cavity or damage to the brain.  Again, all of these complications are uncommon, and spinal fluid leaks can be safely managed surgically if they occur.  The most common complication of sinus surgery is bleeding from the nose, which may require packing or cauterization of the nose.  Continued sinus have polyps may experience recurrence of the polyps requiring revision surgery.  Alterations of sense of smell or injury to the tear ducts are also rare complications.   What is the Surgery Like, and what is the Recovery?  The Surgery usually takes a couple of hours to perform, and is usually performed under a general anesthetic (completely asleep).  Patients are usually discharged home after a couple of hours.  Sometimes during surgery it is necessary to pack the nose to control bleeding, and the packing is left in place for 24 - 48 hours, and removed by your surgeon.    If a septoplasty was performed during the procedure, there is often a splint placed which must be removed after 5-7 days.   Discomfort: Pain is usually mild to moderate, and can be controlled by prescription pain medication or acetaminophen (Tylenol).   Aspirin, Ibuprofen (Advil, Motrin), or Naprosyn (Aleve) should be avoided, as they can cause increased bleeding.  Most patients feel sinus pressure like they have a bad head cold for several days.  Sleeping with your head elevated can help reduce swelling and facial pressure, as can ice packs over the face.  A humidifier may be helpful to keep the mucous and blood from drying in the nose.   Diet: There are no specific diet restrictions, however, you should generally start with clear liquids and a light diet of bland foods because the anesthetic can cause some nausea.  Advance your diet depending on how your stomach feels.  Taking your pain medication with food will often help reduce stomach upset which pain medications can cause.  Nasal Saline Irrigation: It is important to remove blood clots and dried mucous from the nose as it is healing.  This is done by having you irrigate the nose at least 3 - 4 times daily with a salt water solution.  We recommend using NeilMed Sinus Rinse (available at the drug store).  Fill the squeeze bottle with the solution, bend over a sink, and insert the tip of the squeeze bottle into the nose  of an inch.  Point the tip of the squeeze bottle towards the inside corner of the eye on the same side your irrigating.  Squeeze the bottle and gently irrigate the nose.  If you bend forward as you do this, most of the fluid will flow back out of the nose, instead of down your throat.   The solution should be warm, near body temperature, when you irrigate.   Each time you irrigate, you should use a full squeeze bottle.   Note that if you are instructed to use Nasal Steroid Sprays at any time after your surgery, irrigate with saline BEFORE using the steroid spray, so you do not wash it all out of the nose. Another product, Nasal Saline Gel (such as AYR Nasal Saline Gel) can be applied in each nostril 3 - 4 times daily to moisture the nose and reduce scabbing or crusting.  Bleeding:   Bloody drainage from the nose can be expected for several days, and patients are instructed to irrigate their nose frequently with salt water to help remove mucous and blood clots.  The drainage may be dark red or brown, though some fresh blood may be seen intermittently, especially after irrigation.  Do not blow you nose, as bleeding may occur. If you must sneeze, keep your mouth open to allow air to escape through your mouth.  If heavy bleeding occurs: Irrigate the nose with saline to rinse out clots, then spray the nose 3 - 4 times with Afrin Nasal Decongestant Spray.  The spray will constrict the blood vessels to slow bleeding.  Pinch the lower half of your nose shut to apply pressure, and lay down with your head elevated.  Ice packs over the nose may help as well. If bleeding persists despite these measures, you should notify your doctor.  Do not use the Afrin routinely to control nasal congestion after surgery, as it can result in worsening congestion and may affect healing.   Activity: Return to work varies among patients. Most patients will be out of   work at least 5 - 7 days to recover.  Patient may return to work after they are off of narcotic pain medication, and feeling well enough to perform the functions of their job.  Patients must avoid heavy lifting (over 10 pounds) or strenuous physical for 2 weeks after surgery, so your employer may need to assign you to light duty, or keep you out of work longer if light duty is not possible.  NOTE: you should not drive, operate dangerous machinery, do any mentally demanding tasks or make any important legal or financial decisions while on narcotic pain medication and recovering from the general anesthetic.    Call Your Doctor Immediately if You Have Any of the Following: 1. Bleeding that you cannot control with the above measures 2. Loss of vision, double vision, bulging of the eye or black eyes. 3. Fever over 101 degrees 4. Neck stiffness with severe  headache, fever, nausea and change in mental state. You are always encourage to call anytime with concerns, however, please call with requests for pain medication refills during office hours.  Office Endoscopy: During follow-up visits your doctor will remove any packing or splints that may have been placed and evaluate and clean your sinuses endoscopically.  Topical anesthetic will be used to make this as comfortable as possible, though you may want to take your pain medication prior to the visit.  How often this will need to be done varies from patient to patient.  After complete recovery from the surgery, you may need follow-up endoscopy from time to time, particularly if there is concern of recurrent infection or nasal polyps.  [facility] DISCHARGE INSTRUCTIONS FOR MYRINGOTOMY AND TUBE INSERTION  [facility] Bud Face, M.D.   Diet:   After surgery, the patient should take only liquids and foods as tolerated.  The patient may then have a regular diet after the effects of anesthesia have worn off, usually about four to six hours after surgery.  Activities:   The patient should rest until the effects of anesthesia have worn off.  After this, there are no restrictions on the normal daily activities.  Medications:   You will be given antibiotic drops to be used in the ears postoperatively.  It is recommended to use 4 drops 2 times a day for 4 days, then the drops should be saved for possible future use.  The tubes should not cause any discomfort to the patient, but if there is any question, Tylenol should be given according to the instructions for the age of the patient.  Other medications should be continued normally.  Precautions:   Should there be recurrent drainage after the tubes are placed, the drops should be used for approximately 3-4 days.  If it does not clear, you should call the ENT office.  Earplugs:   Earplugs are only needed for those who  are going to be submerged under water.  When taking a bath or shower and using a cup or showerhead to rinse hair, it is not necessary to wear earplugs.  These come in a variety of fashions, all of which can be obtained at our office.  However, if one is not able to come by the office, then silicone plugs can be found at most pharmacies.  It is not advised to stick anything in the ear that is not approved as an earplug.  Silly putty is not to be used as an earplug.  Swimming is allowed in patients after  ear tubes are inserted, however, they must wear earplugs if they are going to be submerged under water.  For those children who are going to be swimming a lot, it is recommended to use a fitted ear mold, which can be made by our audiologist.  If discharge is noticed from the ears, this most likely represents an ear infection.  We would recommend getting your eardrops and using them as indicated above.  If it does not clear, then you should call the ENT office.  For follow up, the patient should return to the ENT office three weeks postoperatively and then every six months as required by the doctor.   General Anesthesia, Adult, Care After This sheet gives you information about how to care for yourself after your procedure. Your health care provider may also give you more specific instructions. If you have problems or questions, contact your health care provider. What can I expect after the procedure? After the procedure, the following side effects are common:  Pain or discomfort at the IV site.  Nausea.  Vomiting.  Sore throat.  Trouble concentrating.  Feeling cold or chills.  Feeling weak or tired.  Sleepiness and fatigue.  Soreness and body aches. These side effects can affect parts of the body that were not involved in surgery. Follow these instructions at home: For the time period you were told by your health care provider:  Rest.  Do not participate in activities where you could fall  or become injured.  Do not drive or use machinery.  Do not drink alcohol.  Do not take sleeping pills or medicines that cause drowsiness.  Do not make important decisions or sign legal documents.  Do not take care of children on your own.   Eating and drinking  Follow any instructions from your health care provider about eating or drinking restrictions.  When you feel hungry, start by eating small amounts of foods that are soft and easy to digest (bland), such as toast. Gradually return to your regular diet.  Drink enough fluid to keep your urine pale yellow.  If you vomit, rehydrate by drinking water, juice, or clear broth. General instructions  If you have sleep apnea, surgery and certain medicines can increase your risk for breathing problems. Follow instructions from your health care provider about wearing your sleep device: ? Anytime you are sleeping, including during daytime naps. ? While taking prescription pain medicines, sleeping medicines, or medicines that make you drowsy.  Have a responsible adult stay with you for the time you are told. It is important to have someone help care for you until you are awake and alert.  Return to your normal activities as told by your health care provider. Ask your health care provider what activities are safe for you.  Take over-the-counter and prescription medicines only as told by your health care provider.  If you smoke, do not smoke without supervision.  Keep all follow-up visits as told by your health care provider. This is important. Contact a health care provider if:  You have nausea or vomiting that does not get better with medicine.  You cannot eat or drink without vomiting.  You have pain that does not get better with medicine.  You are unable to pass urine.  You develop a skin rash.  You have a fever.  You have redness around your IV site that gets worse. Get help right away if:  You have difficulty  breathing.  You have chest pain.  You  have blood in your urine or stool, or you vomit blood. Summary  After the procedure, it is common to have a sore throat or nausea. It is also common to feel tired.  Have a responsible adult stay with you for the time you are told. It is important to have someone help care for you until you are awake and alert.  When you feel hungry, start by eating small amounts of foods that are soft and easy to digest (bland), such as toast. Gradually return to your regular diet.  Drink enough fluid to keep your urine pale yellow.  Return to your normal activities as told by your health care provider. Ask your health care provider what activities are safe for you. This information is not intended to replace advice given to you by your health care provider. Make sure you discuss any questions you have with your health care provider. Document Revised: 12/11/2019 Document Reviewed: 07/10/2019 Elsevier Patient Education  2021 Else[employer].  Scopolamine skin patches Remove in 72 hrs. Wash hands immediately after removal. What is this medicine? SCOPOLAMINE (skoe POL a meen) is used to prevent nausea and vomiting caused by motion sickness, anesthesia and surgery. This medicine may be used for other purposes; ask your health care provider or pharmacist if you have questions. COMMON BRAND NAME(S): Transderm Scop What should I tell my health care provider before I take this medicine? They need to know if you have any of these conditions:  are scheduled to have a gastric secretion test  glaucoma  heart disease  kidney disease  liver disease  lung or breathing disease, like asthma  mental illness  prostate disease  seizures  stomach or intestine problems  trouble passing urine  an unusual or allergic reaction to scopolamine, atropine, other medicines, foods, dyes, or preservatives  pregnant or trying to get pregnant  breast-feeding How should I use  this medicine? This medicine is for external use only. Follow the directions on the prescription label. Wear only 1 patch at a time. Choose an area behind the ear, that is clean, dry, hairless and free from any cuts or irritation. Wipe the area with a clean dry tissue. Peel off the plastic backing of the skin patch, trying not to touch the adhesive side with your hands. Do not cut the patches. Firmly apply to the area you have chosen, with the metallic side of the patch to the skin and the tan-colored side showing. Once firmly in place, wash your hands well with soap and water. Do not get this medicine into your eyes. After removing the patch, wash your hands and the area behind your ear thoroughly with soap and water. The patch will still contain some medicine after use. To avoid accidental contact or ingestion by children or pets, fold the used patch in half with the sticky side together and throw away in the trash out of the reach of children and pets. If you need to use a second patch after you remove the first, place it behind the other ear. A special MedGuide will be given to you by the pharmacist with each prescription and refill. Be sure to read this information carefully each time. Talk to your pediatrician regarding the use of this medicine in children. Special care may be needed. Overdosage: If you think you have taken too much of this medicine contact a poison control center or emergency room at once. NOTE: This medicine is only for you. Do not share this medicine with others.  What if I miss a dose? This does not apply. This medicine is not for regular use. What may interact with this medicine?  alcohol  antihistamines for allergy cough and cold  atropine  certain medicines for anxiety or sleep  certain medicines for bladder problems like oxybutynin, tolterodine  certain medicines for depression like amitriptyline, fluoxetine, sertraline  certain medicines for stomach problems like  dicyclomine, hyoscyamine  certain medicines for Parkinson's disease like benztropine, trihexyphenidyl  certain medicines for seizures like phenobarbital, primidone  general anesthetics like halothane, isoflurane, methoxyflurane, propofol  ipratropium  local anesthetics like lidocaine, pramoxine, tetracaine  medicines that relax muscles for surgery  phenothiazines like chlorpromazine, mesoridazine, prochlorperazine, thioridazine  narcotic medicines for pain  other belladonna alkaloids This list may not describe all possible interactions. Give your health care provider a list of all the medicines, herbs, non-prescription drugs, or dietary supplements you use. Also tell them if you smoke, drink alcohol, or use illegal drugs. Some items may interact with your medicine. What should I watch for while using this medicine? Limit contact with water while swimming and bathing because the patch may fall off. If the patch falls off, throw it away and put a new one behind the other ear. You may get drowsy or dizzy. Do not drive, use machinery, or do anything that needs mental alertness until you know how this medicine affects you. Do not stand or sit up quickly, especially if you are an older patient. This reduces the risk of dizzy or fainting spells. Alcohol may interfere with the effect of this medicine. Avoid alcoholic drinks. Your mouth may get dry. Chewing sugarless gum or sucking hard candy, and drinking plenty of water may help. Contact your healthcare professional if the problem does not go away or is severe. This medicine may cause dry eyes and blurred vision. If you wear contact lenses, you may feel some discomfort. Lubricating drops may help. See your healthcare professional if the problem does not go away or is severe. If you are going to need surgery, an MRI, CT scan, or other procedure, tell your healthcare professional that you are using this medicine. You may need to remove the patch  before the procedure. What side effects may I notice from receiving this medicine? Side effects that you should report to your doctor or health care professional as soon as possible:  allergic reactions like skin rash, itching or hives; swelling of the face, lips, or tongue  blurred vision  changes in vision  confusion  dizziness  eye pain  fast, irregular heartbeat  hallucinations, loss of contact with reality  nausea, vomiting  pain or trouble passing urine  restlessness  seizures  skin irritation  stomach pain Side effects that usually do not require medical attention (report to your doctor or health care professional if they continue or are bothersome):  drowsiness  dry mouth  headache  sore throat This list may not describe all possible side effects. Call your doctor for medical advice about side effects. You may report side effects to FDA at 1-800-FDA-1088. Where should I keep my medicine? Keep out of the reach of children. Store at room temperature between 20 and 25 degrees C (68 and 77 degrees F). Keep this medicine in the foil package until ready to use. Throw away any unused medicine after the expiration date. NOTE: This sheet is a summary. It may not cover all possible information. If you have questions about this medicine, talk to your doctor, pharmacist, or health  care provider.  2021 Elsevier/Gold Standard (2017-06-15 16:14:46)  How to Use an Incentive Spirometer An incentive spirometer is a tool that measures how well you are filling your lungs with each breath. Learning to take long, deep breaths using this tool can help you keep your lungs clear and active. This may help to reverse or lessen your chance of developing breathing (pulmonary) problems, especially infection. You may be asked to use a spirometer:  After a surgery.  If you have a lung problem or a history of smoking.  After a long period of time when you have been unable to move or be  active. If the spirometer includes an indicator to show the highest number that you have reached, your health care provider or respiratory therapist will help you set a goal. Keep a log of your progress as told by your health care provider. What are the risks?  Breathing too quickly may cause dizziness or cause you to pass out. Take your time so you do not get dizzy or light-headed.  If you are in pain, you may need to take pain medicine before doing incentive spirometry. It is harder to take a deep breath if you are having pain. How to use your incentive spirometer 1. Sit up on the edge of your bed or on a chair. 2. Hold the incentive spirometer so that it is in an upright position. 3. Before you use the spirometer, breathe out normally. 4. Place the mouthpiece in your mouth. Make sure your lips are closed tightly around it. 5. Breathe in slowly and as deeply as you can through your mouth, causing the piston or the ball to rise toward the top of the chamber. 6. Hold your breath for 3-5 seconds, or for as long as possible. ? If the spirometer includes a coach indicator, use this to guide you in breathing. Slow down your breathing if the indicator goes above the marked areas. 7. Remove the mouthpiece from your mouth and breathe out normally. The piston or ball will return to the bottom of the chamber. 8. Rest for a few seconds, then repeat the steps 10 or more times. ? Take your time and take a few normal breaths between deep breaths so that you do not get dizzy or light-headed. ? Do this every 1-2 hours when you are awake. 9. If the spirometer includes a goal marker to show the highest number you have reached (best effort), use this as a goal to work toward during each repetition. 10. After each set of 10 deep breaths, cough a few times. This will help to make sure that your lungs are clear. ? If you have an incision on your chest or abdomen from surgery, place a pillow or a rolled-up towel  firmly against the incision when you cough. This can help to reduce pain while taking deep breaths and coughing.   General tips  When you are able to get out of bed: ? Walk around often. ? Continue to take deep breaths and cough in order to clear your lungs.  Keep using the incentive spirometer until your health care provider says it is okay to stop using it. If you have been in the hospital, you may be told to keep using the spirometer at home. Contact a health care provider if:  You are having difficulty using the spirometer.  You have trouble using the spirometer as often as instructed.  Your pain medicine is not giving enough relief for you to use  the spirometer as told.  You have a fever. Get help right away if:  You develop shortness of breath.  You develop a cough with bloody mucus from the lungs.  You have fluid or blood coming from an incision site after you cough. Summary  An incentive spirometer is a tool that can help you learn to take long, deep breaths to keep your lungs clear and active.  You may be asked to use a spirometer after a surgery, if you have a lung problem or a history of smoking, or if you have been inactive for a long period of time.  Use your incentive spirometer as instructed every 1-2 hours while you are awake.  If you have an incision on your chest or abdomen, place a pillow or a rolled-up towel firmly against your incision when you cough. This will help to reduce pain.  Get help right away if you have shortness of breath, you cough up bloody mucus, or blood comes from your incision when you cough. This information is not intended to replace advice given to you by your health care provider. Make sure you discuss any questions you have with your health care provider. Document Revised: 06/16/2019 Document Reviewed: 06/16/2019 Elsevier Patient Education  2021 [employer].

## 2020-06-23 NOTE — H&P (Signed)
..  History and Physical paper copy reviewed and updated date of procedure and will be scanned into system.  Patient seen and examined.  

## 2020-06-23 NOTE — Op Note (Signed)
..06/23/2020  11:58 AM    Anda Latina  [id number]    Pre-Op Dx:  Deviated Nasal Septum, Hypertrophic Inferior Turbinates, chronic maxillary, ethmoid, and sphenoid sinusitis, Nasal polyposis, eustachian tube dysfunction  Post-op Dx: Same  Proc: 1)  Bilateral Maxillary Antrostomy with tissue removal  2)  Bilateral Total Ethmoidectomy  3)  Bilateral Sphenoidotomy  4)  Septoplasty  5)  Bilateral Inferior turbinate reduction  6)  Bilateral Myringotomy and Tympanostomy tube placement  7)  Image Guided Sinus Surgery    Surg:  Roney Mans Laylamarie Meuser  Anes:  GOT  EBL:  75  Comp:  none  Findings: Bilateral inferior turbinate hypertrophy, bilateral nasal polyposis of middle turbinate, ethmoid sinuses, and maxillary sinuses.  Significant polypoid degeneration and mucosal edema.  Purulence noted in posterior meatus bilaterally.  Left sided superior mid septal deviation impacting middle turbinate and nasal side wall.  Procedure: With the patient in a comfortable supine position,  general orotracheal anesthesia was induced without difficulty.  The patient received preoperative Afrin spray for topical decongestion and vasoconstriction.  Using an operating microscope, bilateral anterior inferior myringotomies were performed and an alligator forcep and Pollyann Kennedy pick was used to place bilateral armstrong grommet PE tubes through the tympanic membrane.  Ciprodex drops were placed bilaterally.  At an appropriate level, the patient was placed in a semi-sitting position.  Nasal vibrissae were trimmed.   1% Xylocaine with 1:100,000 epinephrine, 7 cc's, was infiltrated into the anterior floor of the nose, into the nasal spine region, into the membranous columella, and finally into the submucoperichondrial plane of the septum on both sides.  Several minutes were allowed for this to take effect.  Cottoniod pledgetts soaked in Afrin were placed into both nasal cavities and left while the patient was prepped and  draped in the standard fashion.   A proper time-out was performed.  The Stryker image guidance system was set up and calibrated in the normal fashion with an acceptable error of 0.10mm.   The materials were removed from the nose and observed to be intact and correct in number.  The nose was inspected with a headlight and zero degree endoscope with the findings as described above.  A left Killian incision was sharply executed and carried down to the caudal edge of the quadrangular cartilage with a 15 blade scapel.  A mucoperichondrial flap was elelvated along the quadrangular plate back to the bony-cartilaginous junction using caudal elevator and freer elevator. The mucoperiostium was then elevated along the ethmoid plate and the vomer. An itracartilagenous incision was made using the freer elevator and a contralateral mucoperichondiral flap was elevated using a freer elevator.  Care was taken to avoid any large rents or opposing rents in the mucoperichondrial flap.  Boney spurs of the vomer and maxillary crest were removed with Takahashi forceps.  The area of cartilagenous deviation was removed with combination of freer elevator and Takahashi forceps creating a widely patent nasal cavity as well as resolution of obstruction from the cartilagenous deviation. The mucosal flaps were placed back into their anatomic position to allow visualization of the airways. The septum now sat in the midline with an improved airway.  A 4-0 Chromic was used to close the [place] incision as well.   The inferior turbinates were then inspected.  Under endoscopic visualization, the inferior turbinates were infractured bilaterally with a Therapist, nutritional.  A kelly clamp was attached to the anterior-inferior third of each inferior turbinate for approximately one minute.  Under endoscopic visualization, Tru-cutting forceps were  used to remove the anterior-inferior third of each inferior turbinate.  Electrocautery was used to control  bleeding in the area. The remaining turbinate was then outfractured to open up the airway further. There was no significant bleeding noted. The right turbinate was then trimmed and outfractured in a similar fashion.  The airways were then visualized and showed open passageways on both sides that were significantly improved compared to before surgery.       At this point, attention was directed to the functional endoscopic sinus surgery aspect of the procedure.  The nose was next inspected with a zero degree endoscope and the middle turbinates were medialized and afrin soaked pledgets were placed lateral to the turbinates for approximately one minute.  The uncinate process was infractured with a maxillary ostia seeker At this time attention was directed to the patient's maxillary sinuses.  On the left, a ball tipped probe was placed through the natural ostia and this was used to create a larger opening.  Using a Diego microdebrider, the maxillary antrostomy was enlarged for a widely patent maxillary antrostomy.  This was repeated in a simlar fashion on the patient's right side.  Hemostasis was performed with topical Afrin soaked pledgets.  Visualization with a zero degree endoscope was used to examine the bilateral maxillary antrostomies which were noted to be widely patent and in continuity with the natural os bilaterally.  Next attention was directed to the patient's ethmoid sinuses.  The left ethmoid bulla was entered with a straight image guidance suction.  The ethmoid cells were opened from a medial and inferior position superior and laterally until the vertical lamella was encountered.  This was opened and the posterior ethmoid was opened in a similar fashion.  All fragments of bone and mucosa were removed with straight biting forceps.  The skull base was identified and trauma was avoided.  Image guidance was used throughout to ensure all diseased air cells were opened.  This was repeated on the  patient's right side in a similar fashion with all ethmoid air cells opened bilaterally.  Attention at this time was directed to the patient's sphenoid sinuses.  On the patient's left side, the middle turbinate was lateralized and the superior turbinate was identified.  The sphenoid os was visualized and the op was sequentially dilated with an acclarent balloon device and then elarged with 45 degree biting forceps.  This was repeated on the patient's opposite side again with creation of a large sphenoid ostia bilaterally.  At this time with all diseased sinuses opened, the patient's nasal cavity was examinated and copiously irrigated with sterile saline.  Meticulous hemostasis was continued and all sinuses were examined and noted to be widely patent.    Next stamberger sinufoam was placed into the anterior and posterior ethmoid cavities and in the maxillary antrostomy and the cut edge of the inferior turbinates.  Xerogel was placed medial to the middle turbinates bilaterally and inflated with sterile saline.   Care of the patient at this time was transferred to anesthesia and was extubated and taken to PACU in good condition.  There was no signifcant bleeding. Nasal splints were applied to both sides of the septum using Xomed 0.24mm regular sized splints that were trimmed, and then held in position with a 3-0 Nylon through and through suture.  Stamberger sinufoam was placed along the cut edge of the inferior turbinates bilaterally.  The patient was turned back over to anesthesia, and awakened, extubated, and taken to the PACU in satisfactory  condition.  Dispo:   PACU to home  Plan: Ice, elevation, narcotic analgesia, steroid taper, and prophylactic antibiotics for the duration of indwelling nasal foreign bodies.  We will reevaluate the patient in the office in 6 days and remove the septal splints.  Return to work in 10 days, strenuous activities in two weeks.   Roney Mans Camreigh Michie 06/23/2020 11:58  AM

## 2020-06-23 NOTE — Anesthesia Procedure Notes (Signed)
Procedure Name: Intubation Date/Time: 06/23/2020 9:04 AM Performed by: Jimmy Picket, CRNA Pre-anesthesia Checklist: Patient identified, Emergency Drugs available, Suction available, Patient being monitored and Timeout performed Patient Re-evaluated:Patient Re-evaluated prior to induction Oxygen Delivery Method: Circle system utilized Preoxygenation: Pre-oxygenation with 100% oxygen Induction Type: IV induction Ventilation: Mask ventilation without difficulty Laryngoscope Size: Miller and 2 Grade View: Grade I Tube type: Oral Rae Tube size: 7.0 mm Number of attempts: 1 Airway Equipment and Method: Bougie stylet Placement Confirmation: ETT inserted through vocal cords under direct vision,  positive ETCO2 and breath sounds checked- equal and bilateral Tube secured with: Tape Dental Injury: Teeth and Oropharynx as per pre-operative assessment  Comments: Unable to pass ETT directly due to excess tissues to oropharynx. Boujie passed without difficulty and ETT over boujie. Grade I view. Tolerated well by pt. +/= BBS.

## 2020-06-23 NOTE — Anesthesia Postprocedure Evaluation (Signed)
Anesthesia Post Note  Patient: Crystal Cabrera  Procedure(s) Performed: IMAGE GUIDED SINUS SURGERY (N/A Nose) NASAL SEPTOPLASTY WITH TURBINATE REDUCTION BALLOON (Bilateral Nose) ETHMOIDECTOMY (Bilateral Nose) SPHENOIDECTOMY (Bilateral Nose) MAXILLARY ANTROSTOMY with tissue (Bilateral Nose) MYRINGOTOMY WITH TUBE PLACEMENT (Bilateral Ear)     Patient location during evaluation: PACU Anesthesia Type: General Level of consciousness: awake Pain management: pain level controlled Vital Signs Assessment: post-procedure vital signs reviewed and stable Respiratory status: respiratory function stable Cardiovascular status: stable Postop Assessment: no signs of nausea or vomiting Anesthetic complications: no   No complications documented.  Jola Babinski

## 2020-06-24 ENCOUNTER — Encounter: Payer: Self-pay | Admitting: Otolaryngology

## 2020-06-25 LAB — SURGICAL PATHOLOGY

## 2020-09-25 ENCOUNTER — Other Ambulatory Visit: Payer: Self-pay

## 2020-09-25 ENCOUNTER — Ambulatory Visit
Admission: EM | Admit: 2020-09-25 | Discharge: 2020-09-25 | Disposition: A | Discharge status: Home / Self Care | Payer: BC Managed Care – PPO | Attending: Family Medicine | Admitting: Family Medicine

## 2020-09-25 ENCOUNTER — Ambulatory Visit (INDEPENDENT_AMBULATORY_CARE_PROVIDER_SITE_OTHER): Payer: BC Managed Care – PPO

## 2020-09-25 DIAGNOSIS — H66001 Acute suppurative otitis media without spontaneous rupture of ear drum, right ear: Secondary | ICD-10-CM | POA: Diagnosis not present

## 2020-09-25 DIAGNOSIS — R059 Cough, unspecified: Secondary | ICD-10-CM | POA: Diagnosis not present

## 2020-09-25 MED ORDER — AMOXICILLIN-POT CLAVULANATE 875-125 MG PO TABS: 1.0000 | ORAL_TABLET | Freq: Two times a day (BID) | ORAL | 0 refills | Status: DC

## 2020-09-25 NOTE — ED Triage Notes (Signed)
Patient states that she has bilateral ear pain. States that she was positive for covid on 06/7. States that she has been having bilateral ear drainage. States that she had been improving from covid, but suddenly started coughing again and thinks that she may have pneumonia. States that she is a T1D and blood sugars have been elevated (200-350).

## 2020-09-25 NOTE — Discharge Instructions (Addendum)
Medication as prescribed.  Take care  Dr. Italy Warriner  

## 2020-09-25 NOTE — ED Provider Notes (Signed)
[facility]    CSN: [id number] Arrival date & time: 09/25/20  1110  History   Chief Complaint Chief Complaint  Patient presents with   Ear Pain   Cough   HPI  40 year old female presents with the above complaints.  Patient states that she recently tested positive for COVID-19.  She has recently developed sore throat, cough, congestion, sinus pain and pressure.  She also has discharge from both of her ears.  She states that she has a history of pneumonia.  She is concerned about the possibility of pneumonia.  No documented fever.  Blood sugars are elevated.  Per review of the EMR, she has uncontrolled diabetes.  Past Medical History:  Diagnosis Date   Diabetes mellitus without complication (HCC)    type 1   Headache    sinus   Hypertension    Scoliosis    Tachycardia    Vertigo    with sinus congestion   Past Surgical History:  Procedure Laterality Date   ANKLE SURGERY     CESAREAN SECTION     ETHMOIDECTOMY Bilateral 06/23/2020   Procedure: ETHMOIDECTOMY;  Surgeon: Bud Face, MD;  Location: [facility];  Service: ENT;  Laterality: Bilateral;   IMAGE GUIDED SINUS SURGERY N/A 06/23/2020   Procedure: IMAGE GUIDED SINUS SURGERY;  Surgeon: Bud Face, MD;  Location: [facility];  Service: ENT;  Laterality: N/A;  placed disk on OR CHARGE nurse desk 3-10-kp   leg sugery  rt     MAXILLARY ANTROSTOMY Bilateral 06/23/2020   Procedure: MAXILLARY ANTROSTOMY with tissue;  Surgeon: Bud Face, MD;  Location: [facility];  Service: ENT;  Laterality: Bilateral;   MYRINGOTOMY WITH TUBE PLACEMENT Bilateral 06/23/2020   Procedure: MYRINGOTOMY WITH TUBE PLACEMENT;  Surgeon: Bud Face, MD;  Location: [facility];  Service: ENT;  Laterality: Bilateral;  Diabetic - insulin   NASAL SEPTOPLASTY W/ TURBINOPLASTY Bilateral 06/23/2020   Procedure: NASAL SEPTOPLASTY WITH TURBINATE REDUCTION BALLOON;  Surgeon: Bud Face, MD;   Location: [facility];  Service: ENT;  Laterality: Bilateral;   SPHENOIDECTOMY Bilateral 06/23/2020   Procedure: Selina Cooley;  Surgeon: Bud Face, MD;  Location: [facility];  Service: ENT;  Laterality: Bilateral;    OB History   No obstetric history on file.      Home Medications    Prior to Admission medications   Medication Sig Start Date End Date Taking? Authorizing Provider  albuterol (VENTOLIN HFA) 108 (90 Base) MCG/ACT inhaler Inhale 2 puffs into the lungs every 4 (four) hours as needed. 04/19/20  Yes Becky Augusta, NP  amLODipine (NORVASC) 5 MG tablet Take 1 tablet (5 mg total) by mouth daily. 09/15/19  Yes Darling Cieslewicz G, DO  amoxicillin-clavulanate (AUGMENTIN) 875-125 MG tablet Take 1 tablet by mouth 2 (two) times daily. 09/25/20  Yes Teddy Rebstock G, DO  atorvastatin (LIPITOR) 10 MG tablet Take 10 mg by mouth daily. 04/25/20  Yes [provider]  azelastine (ASTELIN) 0.1 % nasal spray SMARTSIG:1-2 Spray(s) Both Nares Every 12 Hours PRN 05/18/20  Yes [provider]  carvedilol (COREG) 12.5 MG tablet Take 1 tablet (12.5 mg total) by mouth 2 (two) times daily with a meal. 09/15/19  Yes Lavaun Greenfield G, DO  cloNIDine (CATAPRES) 0.1 MG tablet TAKE 1 TABLET (0.1 MG TOTAL) BY MOUTH 2 (TWO) TIMES DAILY 09/15/19  Yes Monnie Anspach G, DO  fluticasone (FLONASE) 50 MCG/ACT nasal spray Place 2 sprays into both nostrils daily. 05/18/20  Yes [provider]  insulin aspart (FIASP FLEXTOUCH) 100 UNIT/ML FlexTouch Pen Inject 34 Units into the skin with breakfast, with lunch, and with evening meal. 09/15/19  Yes Majesty Stehlin G, DO  lisinopril (ZESTRIL) 10 MG tablet Take 1 tablet (10 mg total) by mouth daily. 09/15/19  Yes Genesi Stefanko G, DO  NOVOLOG FLEXPEN 100 UNIT/ML FlexPen SMARTSIG:34 Unit(s) SUB-Q Twice Daily 09/20/20  Yes [provider]  TRESIBA FLEXTOUCH 200 UNIT/ML FlexTouch Pen Inject 70 Units into the skin daily. 03/25/20  Yes [provider]     Family History Family History  Problem Relation Age of Onset   Healthy Mother    Healthy Father     Social History Social History   Tobacco Use   Smoking status: Never   Smokeless tobacco: Never  Vaping Use   Vaping Use: Never used  Substance Use Topics   Alcohol use: Not Currently   Drug use: Never     Allergies   Patient has no known allergies.   Review of Systems Review of Systems Per HPI  Physical Exam Triage Vital Signs ED Triage Vitals  Enc Vitals Group     BP 09/25/20 1136 (!) 168/98     Pulse Rate 09/25/20 1136 (!) 102     Resp 09/25/20 1136 18     Temp 09/25/20 1136 98.5 F (36.9 C)     Temp Source 09/25/20 1136 Oral     SpO2 09/25/20 1136 99 %     Weight 09/25/20 1133 215 lb (97.5 kg)     Height 09/25/20 1133 5\' 5"  (1.651 m)     Head Circumference --      Peak Flow --      Pain Score 09/25/20 1132 7     Pain Loc --      Pain Edu? --      Excl. in GC? --    Updated Vital Signs BP (!) 168/98 (BP Location: Right Arm)   Pulse (!) 102   Temp 98.5 F (36.9 C) (Oral)   Resp 18   Ht 5\' 5"  (1.651 m)   Wt 97.5 kg   LMP 09/04/2020   SpO2 99%   BMI 35.78 kg/m   Visual Acuity Right Eye Distance:   Left Eye Distance:   Bilateral Distance:    Right Eye Near:   Left Eye Near:    Bilateral Near:     Physical Exam Vitals and nursing note reviewed.  Constitutional:      General: She is not in acute distress.    Appearance: Normal appearance. She is obese.  HENT:     Head: Normocephalic and atraumatic.     Ears:     Comments: Tympanostomy tubes noted bilaterally.  Left TM appears normal other than having a tympanostomy tube in place.  Right TM with erythema and purulent effusion.    Nose: Congestion present.  Eyes:     General:        Right eye: No discharge.        Left eye: No discharge.     Conjunctiva/sclera: Conjunctivae normal.  Cardiovascular:     Rate and Rhythm: Regular rhythm. Tachycardia present.  Pulmonary:     Effort:  Pulmonary effort is normal.     Breath sounds: Normal breath sounds. No wheezing, rhonchi or rales.  Neurological:     Mental Status: She is alert.  Psychiatric:        Mood and Affect: Mood normal.        Behavior: Behavior  normal.    UC Treatments / Results  Labs (all labs ordered are listed, but only abnormal results are displayed) Labs Reviewed - No data to display  EKG   Radiology DG Chest 2 View  Result Date: 09/25/2020 CLINICAL DATA:  Cough 2 weeks EXAM: CHEST - 2 VIEW COMPARISON:  None. FINDINGS: The heart size and mediastinal contours are within normal limits. Both lungs are clear. The visualized skeletal structures are unremarkable. IMPRESSION: No active cardiopulmonary disease. Electronically Signed   By: Marlan Palau M.D.   On: 09/25/2020 12:29    Procedures Procedures (including critical care time)  Medications Ordered in UC Medications - No data to display  Initial Impression / Assessment and Plan / UC Course  I have reviewed the triage vital signs and the nursing notes.  Pertinent labs & imaging results that were available during my care of the patient were reviewed by me and considered in my medical decision making (see chart for details).    40 year old female presents with respiratory symptoms.  Patient has obvious otitis media with purulence behind the TM and purulent discharge coming from her tympanostomy tube.  Augmentin as prescribed.  Supportive care.  Final Clinical Impressions(s) / UC Diagnoses   Final diagnoses:  Acute suppurative otitis media of right ear without spontaneous rupture of tympanic membrane, recurrence not specified     Discharge Instructions      Medication as prescribed.  Take care  Dr. Adriana Simas    ED Prescriptions     Medication Sig Dispense Auth. Provider   amoxicillin-clavulanate (AUGMENTIN) 875-125 MG tablet Take 1 tablet by mouth 2 (two) times daily. 20 tablet Tommie Sams, DO      PDMP not reviewed this  encounter.   Tommie Sams, [place] 09/25/20 1621

## 2021-05-21 ENCOUNTER — Other Ambulatory Visit: Payer: Self-pay

## 2021-05-21 ENCOUNTER — Ambulatory Visit (INDEPENDENT_AMBULATORY_CARE_PROVIDER_SITE_OTHER): Payer: BC Managed Care – PPO

## 2021-05-21 ENCOUNTER — Ambulatory Visit
Admission: RE | Admit: 2021-05-21 | Discharge: 2021-05-21 | Disposition: A | Discharge status: Home / Self Care | Payer: BC Managed Care – PPO | Source: Ambulatory Visit | Attending: Emergency Medicine | Admitting: Emergency Medicine

## 2021-05-21 VITALS — BP 144/92 | HR 104 | Temp 99.2°F | Resp 18 | Ht 65.0 in | Wt 215.0 lb

## 2021-05-21 DIAGNOSIS — R059 Cough, unspecified: Secondary | ICD-10-CM

## 2021-05-21 DIAGNOSIS — J069 Acute upper respiratory infection, unspecified: Secondary | ICD-10-CM | POA: Diagnosis not present

## 2021-05-21 DIAGNOSIS — H66003 Acute suppurative otitis media without spontaneous rupture of ear drum, bilateral: Secondary | ICD-10-CM | POA: Diagnosis not present

## 2021-05-21 MED ORDER — BENZONATATE 100 MG PO CAPS: 200.0000 mg | ORAL_CAPSULE | Freq: Three times a day (TID) | ORAL | 0 refills | Status: DC

## 2021-05-21 MED ORDER — AMOXICILLIN-POT CLAVULANATE 875-125 MG PO TABS: 1.0000 | ORAL_TABLET | Freq: Two times a day (BID) | ORAL | 0 refills | Status: AC

## 2021-05-21 MED ORDER — PROMETHAZINE-DM 6.25-15 MG/5ML PO SYRP: 5.0000 mL | ORAL_SOLUTION | Freq: Four times a day (QID) | ORAL | 0 refills | Status: DC | PRN

## 2021-05-21 NOTE — Discharge Instructions (Signed)
Take the Augmentin twice daily for 10 days with food for treatment of your ear infection.  Take an over-the-counter probiotic 1 hour after each dose of antibiotic to prevent diarrhea.  Use over-the-counter Tylenol and ibuprofen as needed for pain or fever.  Place a hot water bottle, or heating pad, underneath your pillowcase at night to help dilate up your ear and aid in pain relief as well as resolution of the infection.  Continue your Astelin nasal spray for nasal congestion.  Sinus irrigation can also help with your nasal congestion. Use a Neilmed sinus rinse kit and distilled water to do so. So not use tap water.  Use the Tessalon Perles every 8 hours during the day.  Take them with a small sip of water.  They may give you some numbness to the base of your tongue or a metallic taste in your mouth, this is normal.  Use the Promethazine DM cough syrup at bedtime for cough and congestion.  It will make you drowsy so do not take it during the day.  Return for reevaluation or see your primary care provider for any new or worsening symptoms.    Return for reevaluation for any new or worsening symptoms.

## 2021-05-21 NOTE — ED Provider Notes (Signed)
[facility]    CSN: [id number] Arrival date & time: 05/21/21  0855      History   Chief Complaint Chief Complaint  Patient presents with   Cough    9am Appointment    HPI Crystal Cabrera is a 41 y.o. female.   HPI  41 year old female here for evaluation of respiratory complaints.  Patient reports that she has been experiencing fatigue, nasal congestion with thick nasal discharge, feeling cold all the time, body aches, sore throat, ear pain, and a productive cough for green sputum.  Also some intermittent shortness of breath and wheezing.  She denies any GI symptoms.  She reports that in the last 1 to 2 days she has developed some chest heaviness from coughing.  She states that she still has a sore throat but it is much less intense than it was for the first 3 days of symptoms.  On the first 3 days of symptoms it was burning in nature and now it is just feels irritated and scratchy.  She has taken home COVID test and it was negative.  Past Medical History:  Diagnosis Date   Diabetes mellitus without complication (HCC)    type 1   Headache    sinus   Hypertension    Scoliosis    Tachycardia    Vertigo    with sinus congestion    There are no problems to display for this patient.   Past Surgical History:  Procedure Laterality Date   ANKLE SURGERY     CESAREAN SECTION     ETHMOIDECTOMY Bilateral 06/23/2020   Procedure: ETHMOIDECTOMY;  Surgeon: Carloyn Manner, MD;  Location: [place];  Service: ENT;  Laterality: Bilateral;   IMAGE GUIDED SINUS SURGERY N/A 06/23/2020   Procedure: IMAGE GUIDED SINUS SURGERY;  Surgeon: Carloyn Manner, MD;  Location: [place];  Service: ENT;  Laterality: N/A;  placed disk on OR CHARGE nurse desk 3-10-kp   leg sugery  rt     MAXILLARY ANTROSTOMY Bilateral 06/23/2020   Procedure: MAXILLARY ANTROSTOMY with tissue;  Surgeon: Carloyn Manner, MD;  Location: [place];  Service: ENT;  Laterality:  Bilateral;   MYRINGOTOMY WITH TUBE PLACEMENT Bilateral 06/23/2020   Procedure: MYRINGOTOMY WITH TUBE PLACEMENT;  Surgeon: Carloyn Manner, MD;  Location: [place];  Service: ENT;  Laterality: Bilateral;  Diabetic - insulin   NASAL SEPTOPLASTY W/ TURBINOPLASTY Bilateral 06/23/2020   Procedure: NASAL SEPTOPLASTY WITH TURBINATE REDUCTION BALLOON;  Surgeon: Carloyn Manner, MD;  Location: [place];  Service: ENT;  Laterality: Bilateral;   SPHENOIDECTOMY Bilateral 06/23/2020   Procedure: Coralee Pesa;  Surgeon: Carloyn Manner, MD;  Location: [place];  Service: ENT;  Laterality: Bilateral;    OB History   No obstetric history on file.      Home Medications    Prior to Admission medications   Medication Sig Start Date End Date Taking? Authorizing Provider  amoxicillin-clavulanate (AUGMENTIN) 875-125 MG tablet Take 1 tablet by mouth every 12 (twelve) hours for 10 days. 05/21/21 05/31/21 Yes Margarette Canada, NP  benzonatate (TESSALON) 100 MG capsule Take 2 capsules (200 mg total) by mouth every 8 (eight) hours. 05/21/21  Yes Margarette Canada, NP  promethazine-dextromethorphan (PROMETHAZINE-DM) 6.25-15 MG/5ML syrup Take 5 mLs by mouth 4 (four) times daily as needed. 05/21/21  Yes Margarette Canada, NP  amLODipine (NORVASC) 5 MG tablet Take 1 tablet (5 mg total) by mouth daily. 09/15/19   Coral Spikes, DO  atorvastatin (LIPITOR) 10 MG tablet  Take 10 mg by mouth daily. 04/25/20   [provider]  azelastine (ASTELIN) 0.1 % nasal spray SMARTSIG:1-2 Spray(s) Both Nares Every 12 Hours PRN 05/18/20   [provider]  carvedilol (COREG) 12.5 MG tablet Take 1 tablet (12.5 mg total) by mouth 2 (two) times daily with a meal. 09/15/19   Cook, Michael Boston G, DO  cloNIDine (CATAPRES) 0.1 MG tablet TAKE 1 TABLET (0.1 MG TOTAL) BY MOUTH 2 (TWO) TIMES DAILY 09/15/19   Cook, Jayce G, DO  fluticasone (FLONASE) 50 MCG/ACT nasal spray Place 2 sprays into both nostrils daily. 05/18/20    [provider]  insulin aspart (FIASP FLEXTOUCH) 100 UNIT/ML FlexTouch Pen Inject 34 Units into the skin with breakfast, with lunch, and with evening meal. 09/15/19   Cook, Michael Boston G, DO  lisinopril (ZESTRIL) 10 MG tablet Take 1 tablet (10 mg total) by mouth daily. 09/15/19   Cook, Jayce G, DO  NOVOLOG FLEXPEN 100 UNIT/ML FlexPen SMARTSIG:34 Unit(s) SUB-Q Twice Daily 09/20/20   [provider]  TRESIBA FLEXTOUCH 200 UNIT/ML FlexTouch Pen Inject 70 Units into the skin daily. 03/25/20   [provider]    Family History Family History  Problem Relation Age of Onset   Healthy Mother    Healthy Father     Social History Social History   Tobacco Use   Smoking status: Never   Smokeless tobacco: Never  Vaping Use   Vaping Use: Never used  Substance Use Topics   Alcohol use: Not Currently   Drug use: Never     Allergies   Patient has no known allergies.   Review of Systems Review of Systems  Constitutional:  Positive for chills and fatigue. Negative for activity change and fever.  HENT:  Positive for congestion, ear discharge, ear pain, rhinorrhea and sore throat.   Respiratory:  Positive for cough, shortness of breath and wheezing.   Gastrointestinal:  Negative for diarrhea, nausea and vomiting.  Musculoskeletal:  Positive for arthralgias and myalgias.  Skin:  Negative for rash.  Hematological: Negative.   Psychiatric/Behavioral: Negative.      Physical Exam Triage Vital Signs ED Triage Vitals  Enc Vitals Group     BP 05/21/21 0910 (!) 144/92     Pulse Rate 05/21/21 0910 (!) 104     Resp 05/21/21 0910 18     Temp 05/21/21 0910 99.2 F (37.3 C)     Temp Source 05/21/21 0910 Oral     SpO2 05/21/21 0910 96 %     Weight 05/21/21 0908 215 lb (97.5 kg)     Height 05/21/21 0908 5' 5"  (1.651 m)     Head Circumference --      Peak Flow --      Pain Score 05/21/21 0908 3     Pain Loc --      Pain Edu? --      Excl. in [place]? --    No data  found.  Updated Vital Signs BP (!) 144/92 (BP Location: Right Arm)    Pulse (!) 104    Temp 99.2 F (37.3 C) (Oral)    Resp 18    Ht 5' 5"  (1.651 m)    Wt 215 lb (97.5 kg)    LMP 05/07/2021    SpO2 96%    BMI 35.78 kg/m   Visual Acuity Right Eye Distance:   Left Eye Distance:   Bilateral Distance:    Right Eye Near:   Left Eye Near:    Bilateral  Near:     Physical Exam Vitals and nursing note reviewed.  Constitutional:      Appearance: Normal appearance. She is ill-appearing.  HENT:     Head: Normocephalic and atraumatic.     Right Ear: Ear canal and external ear normal.     Left Ear: Ear canal and external ear normal.     Nose: Congestion and rhinorrhea present.     Mouth/Throat:     Mouth: Mucous membranes are moist.     Pharynx: Oropharynx is clear. Posterior oropharyngeal erythema present.  Cardiovascular:     Rate and Rhythm: Normal rate and regular rhythm.     Pulses: Normal pulses.     Heart sounds: Normal heart sounds. No murmur heard.   No friction rub. No gallop.  Pulmonary:     Effort: Pulmonary effort is normal.     Breath sounds: Normal breath sounds. No wheezing, rhonchi or rales.  Musculoskeletal:     Cervical back: Normal range of motion and neck supple.  Lymphadenopathy:     Cervical: No cervical adenopathy.  Skin:    General: Skin is warm and dry.     Capillary Refill: Capillary refill takes less than 2 seconds.     Findings: No erythema or rash.  Neurological:     General: No focal deficit present.     Mental Status: She is alert and oriented to person, place, and time.  Psychiatric:        Mood and Affect: Mood normal.        Behavior: Behavior normal.        Thought Content: Thought content normal.        Judgment: Judgment normal.     UC Treatments / Results  Labs (all labs ordered are listed, but only abnormal results are displayed) Labs Reviewed - No data to display  EKG   Radiology No results found.  Procedures Procedures  (including critical care time)  Medications Ordered in UC Medications - No data to display  Initial Impression / Assessment and Plan / UC Course  I have reviewed the triage vital signs and the nursing notes.  Pertinent labs & imaging results that were available during my care of the patient were reviewed by me and considered in my medical decision making (see chart for details).  Patient is a pleasant, though ill-appearing, 41 year old female here for evaluation of respiratory symptoms that have been ongoing for the last week and a half as outlined in the HPI above.  Patient does report ear pain and states that she has TM tubes in place.  They have been draining a yellow fluid from both the ears.  Her nasal discharge is thick and gray-green and her cough is productive for the same looking sputum.  There is associated shortness of breath and wheezing per the patient's report.  She is a Pharmacist, hospital.  On exam patient does have erythematous and injected tympanic membranes bilaterally.  There are 2 TM tubes in place and they are draining a pale yellow drainage.  Both external auditory canals are clear but there is some scant drainage on the floor of both canals.  Her nasal mucosa is erythematous and edematous and there is thick gray-green discharge present in the left nare.  Oropharyngeal exam reveals mild posterior oropharyngeal erythema with postnasal drip.  No cervical of adenopathy appreciable exam.  Cardiopulmonary dam feels clung sounds in all fields.  Will obtain chest x-ray to look for the presence of pneumonia.  Chest [facility]  independently reviewed and evaluated by me.  Impression: No evidence of infiltrate or effusion noted.  Radiology overread is pending.  We will discharge patient home with a diagnosis of respiratory infection and otitis media.  I will place her on Augmentin twice daily for 10 days for treatment of both.  I have encouraged her to continue her Astelin nasal spray and to follow-up her  installation with a squirt of nasal saline to push the particles up to her turbinates where that will take effect.  I am also encouraged her to perform sinus irrigation to help wash out her mucus collection and help Postnasal Drip Which I Believe Is Feeding Her Cough.  I Have Also Given Her Tessalon Perles to Use during the Day and Promethazine DM Cough Syrup Use at Bedtime.  Return Precautions Reviewed with Patient.   Final Clinical Impressions(s) / UC Diagnoses   Final diagnoses:  Upper respiratory tract infection, unspecified type  Non-recurrent acute suppurative otitis media of both ears without spontaneous rupture of tympanic membranes     Discharge Instructions      Take the Augmentin twice daily for 10 days with food for treatment of your ear infection.  Take an over-the-counter probiotic 1 hour after each dose of antibiotic to prevent diarrhea.  Use over-the-counter Tylenol and ibuprofen as needed for pain or fever.  Place a hot water bottle, or heating pad, underneath your pillowcase at night to help dilate up your ear and aid in pain relief as well as resolution of the infection.  Continue your Astelin nasal spray for nasal congestion.  Sinus irrigation can also help with your nasal congestion. Use a Neilmed sinus rinse kit and distilled water to do so. So not use tap water.  Use the Tessalon Perles every 8 hours during the day.  Take them with a small sip of water.  They may give you some numbness to the base of your tongue or a metallic taste in your mouth, this is normal.  Use the Promethazine DM cough syrup at bedtime for cough and congestion.  It will make you drowsy so do not take it during the day.  Return for reevaluation or see your primary care provider for any new or worsening symptoms.    Return for reevaluation for any new or worsening symptoms.      ED Prescriptions     Medication Sig Dispense Auth. Provider   amoxicillin-clavulanate (AUGMENTIN)  875-125 MG tablet Take 1 tablet by mouth every 12 (twelve) hours for 10 days. 20 tablet Margarette Canada, NP   benzonatate (TESSALON) 100 MG capsule Take 2 capsules (200 mg total) by mouth every 8 (eight) hours. 21 capsule Margarette Canada, NP   promethazine-dextromethorphan (PROMETHAZINE-DM) 6.25-15 MG/5ML syrup Take 5 mLs by mouth 4 (four) times daily as needed. 118 mL Margarette Canada, NP      PDMP not reviewed this encounter.   Margarette Canada, NP 05/21/21 1004

## 2021-05-21 NOTE — ED Triage Notes (Signed)
Pt here with C/O fatigue, chest congestion, nasal congestion for 1.5 weeks. Chest feels heavy from coughing. Has taken at home Covid it is negative.

## 2022-01-07 ENCOUNTER — Encounter: Payer: Self-pay | Admitting: Emergency Medicine

## 2022-01-07 ENCOUNTER — Ambulatory Visit
Admission: EM | Admit: 2022-01-07 | Discharge: 2022-01-07 | Disposition: A | Discharge status: Home / Self Care | Payer: BC Managed Care – PPO | Attending: Physician Assistant | Admitting: Physician Assistant

## 2022-01-07 DIAGNOSIS — R051 Acute cough: Secondary | ICD-10-CM | POA: Diagnosis present

## 2022-01-07 DIAGNOSIS — E109 Type 1 diabetes mellitus without complications: Secondary | ICD-10-CM | POA: Diagnosis not present

## 2022-01-07 DIAGNOSIS — R220 Localized swelling, mass and lump, head: Secondary | ICD-10-CM | POA: Insufficient documentation

## 2022-01-07 DIAGNOSIS — R0981 Nasal congestion: Secondary | ICD-10-CM | POA: Diagnosis not present

## 2022-01-07 DIAGNOSIS — H66001 Acute suppurative otitis media without spontaneous rupture of ear drum, right ear: Secondary | ICD-10-CM | POA: Insufficient documentation

## 2022-01-07 DIAGNOSIS — Z20822 Contact with and (suspected) exposure to covid-19: Secondary | ICD-10-CM | POA: Diagnosis not present

## 2022-01-07 DIAGNOSIS — Z794 Long term (current) use of insulin: Secondary | ICD-10-CM | POA: Diagnosis not present

## 2022-01-07 DIAGNOSIS — J019 Acute sinusitis, unspecified: Secondary | ICD-10-CM | POA: Diagnosis not present

## 2022-01-07 DIAGNOSIS — I10 Essential (primary) hypertension: Secondary | ICD-10-CM | POA: Diagnosis not present

## 2022-01-07 LAB — GROUP A STREP BY PCR: Group A Strep by PCR: NOT DETECTED

## 2022-01-07 LAB — SARS CORONAVIRUS 2 BY RT PCR: SARS Coronavirus 2 by RT PCR: NEGATIVE

## 2022-01-07 MED ORDER — IPRATROPIUM BROMIDE 0.06 % NA SOLN: 2.0000 | Freq: Four times a day (QID) | NASAL | 0 refills | Status: DC

## 2022-01-07 MED ORDER — AMOXICILLIN-POT CLAVULANATE 875-125 MG PO TABS: 1.0000 | ORAL_TABLET | Freq: Two times a day (BID) | ORAL | 0 refills | Status: AC

## 2022-01-07 MED ORDER — CIPROFLOXACIN-DEXAMETHASONE 0.3-0.1 % OT SUSP: 4.0000 [drp] | Freq: Two times a day (BID) | OTIC | 0 refills | Status: AC

## 2022-01-07 NOTE — ED Provider Notes (Signed)
[facility]    CSN: [id number] Arrival date & time: 01/07/22  1412      History   Chief Complaint Chief Complaint  Patient presents with   Facial Swelling   Generalized Body Aches   Cough    HPI Crystal Cabrera is a 41 y.o. female presenting for approximately 2-day history of facial swelling, cough, nasal congestion, drainage from the right ear, sore throat, postnasal drainage.  Denies fever but says she does not run fevers.  Patient reports a long history of problems with her sinuses and has had to have sinus surgeries.  The last surgery was a year and a half ago.  Patient also reports having tubes placed in her ears at the same time.  She says things have not really seem to any better since she has had the surgery.  She says that her son had COVID 2 weeks ago.  She would like to be checked for COVID-19.  She has not had any breathing difficulty or wheezing.  She has been taking over-the-counter decongestants for symptoms.  Other medical history significant for type 1 diabetes, hypertension.  HPI  Past Medical History:  Diagnosis Date   Diabetes mellitus without complication (HCC)    type 1   Headache    sinus   Hypertension    Scoliosis    Tachycardia    Vertigo    with sinus congestion    There are no problems to display for this patient.   Past Surgical History:  Procedure Laterality Date   ANKLE SURGERY     CESAREAN SECTION     ETHMOIDECTOMY Bilateral 06/23/2020   Procedure: ETHMOIDECTOMY;  Surgeon: Bud Face, MD;  Location: [facility];  Service: ENT;  Laterality: Bilateral;   IMAGE GUIDED SINUS SURGERY N/A 06/23/2020   Procedure: IMAGE GUIDED SINUS SURGERY;  Surgeon: Bud Face, MD;  Location: [facility];  Service: ENT;  Laterality: N/A;  placed disk on OR CHARGE nurse desk 3-10-kp   leg sugery  rt     MAXILLARY ANTROSTOMY Bilateral 06/23/2020   Procedure: MAXILLARY ANTROSTOMY with tissue;  Surgeon: Bud Face, MD;   Location: [facility];  Service: ENT;  Laterality: Bilateral;   MYRINGOTOMY WITH TUBE PLACEMENT Bilateral 06/23/2020   Procedure: MYRINGOTOMY WITH TUBE PLACEMENT;  Surgeon: Bud Face, MD;  Location: [facility];  Service: ENT;  Laterality: Bilateral;  Diabetic - insulin   NASAL SEPTOPLASTY W/ TURBINOPLASTY Bilateral 06/23/2020   Procedure: NASAL SEPTOPLASTY WITH TURBINATE REDUCTION BALLOON;  Surgeon: Bud Face, MD;  Location: [facility];  Service: ENT;  Laterality: Bilateral;   SPHENOIDECTOMY Bilateral 06/23/2020   Procedure: Selina Cooley;  Surgeon: Bud Face, MD;  Location: [facility];  Service: ENT;  Laterality: Bilateral;    OB History   No obstetric history on file.      Home Medications    Prior to Admission medications   Medication Sig Start Date End Date Taking? Authorizing Provider  amLODipine (NORVASC) 5 MG tablet Take 1 tablet (5 mg total) by mouth daily. 09/15/19  Yes Cook, Jayce G, DO  amoxicillin-clavulanate (AUGMENTIN) 875-125 MG tablet Take 1 tablet by mouth every 12 (twelve) hours for 10 days. 01/07/22 01/17/22 Yes Eusebio Friendly B, PA-C  atorvastatin (LIPITOR) 10 MG tablet Take 10 mg by mouth daily. 04/25/20  Yes [provider]  carvedilol (COREG) 12.5 MG tablet Take 1 tablet (12.5 mg total) by mouth 2 (two) times daily with a meal. 09/15/19  Yes Tommie Sams,  DO  ciprofloxacin-dexamethasone (CIPRODEX) OTIC suspension Place 4 drops into the right ear 2 (two) times daily for 7 days. 01/07/22 01/14/22 Yes Eusebio Friendly B, PA-C  insulin aspart (FIASP FLEXTOUCH) 100 UNIT/ML FlexTouch Pen Inject 34 Units into the skin with breakfast, with lunch, and with evening meal. 09/15/19  Yes Cook, Jayce G, DO  ipratropium (ATROVENT) 0.06 % nasal spray Place 2 sprays into both nostrils 4 (four) times daily. 01/07/22  Yes Eusebio Friendly B, PA-C  NOVOLOG FLEXPEN 100 UNIT/ML FlexPen SMARTSIG:34 Unit(s) SUB-Q Twice Daily 09/20/20  Yes  [provider]  TRESIBA FLEXTOUCH 200 UNIT/ML FlexTouch Pen Inject 70 Units into the skin daily. 03/25/20  Yes [provider]  azelastine (ASTELIN) 0.1 % nasal spray SMARTSIG:1-2 Spray(s) Both Nares Every 12 Hours PRN 05/18/20   [provider]  cloNIDine (CATAPRES) 0.1 MG tablet TAKE 1 TABLET (0.1 MG TOTAL) BY MOUTH 2 (TWO) TIMES DAILY 09/15/19   Cook, Jayce G, DO  fluticasone (FLONASE) 50 MCG/ACT nasal spray Place 2 sprays into both nostrils daily. 05/18/20   [provider]  lisinopril (ZESTRIL) 10 MG tablet Take 1 tablet (10 mg total) by mouth daily. 09/15/19   Tommie Sams, DO  losartan (COZAAR) 50 MG tablet Take 50 mg by mouth daily. 12/28/21   [provider]    Family History Family History  Problem Relation Age of Onset   Healthy Mother    Healthy Father     Social History Social History   Tobacco Use   Smoking status: Never   Smokeless tobacco: Never  Vaping Use   Vaping Use: Never used  Substance Use Topics   Alcohol use: Not Currently   Drug use: Never     Allergies   Patient has no known allergies.   Review of Systems Review of Systems  Constitutional:  Positive for fatigue. Negative for chills, diaphoresis and fever.  HENT:  Positive for congestion, ear discharge, ear pain, facial swelling, rhinorrhea, sinus pressure, sinus pain and sore throat.   Respiratory:  Positive for cough. Negative for shortness of breath.   Gastrointestinal:  Negative for abdominal pain, nausea and vomiting.  Musculoskeletal:  Negative for arthralgias and myalgias.  Skin:  Negative for rash.  Neurological:  Positive for headaches. Negative for weakness.  Hematological:  Negative for adenopathy.     Physical Exam Triage Vital Signs ED Triage Vitals  Enc Vitals Group     BP      Pulse      Resp      Temp      Temp src      SpO2      Weight      Height      Head Circumference      Peak Flow      Pain Score      Pain Loc      Pain  Edu?      Excl. in [place]?    No data found.  Updated Vital Signs BP 138/89 (BP Location: Right Arm)   Pulse (!) 104   Temp 98.6 F (37 C) (Oral)   Resp 14   Ht 5\' 5"  (1.651 m)   Wt 215 lb (97.5 kg)   LMP 12/24/2021 (Approximate)   SpO2 99%   BMI 35.78 kg/m    Physical Exam Vitals and nursing note reviewed.  Constitutional:      General: She is not in acute distress.    Appearance: Normal appearance. She is ill-appearing. She  is not toxic-appearing.  HENT:     Head: Normocephalic and atraumatic.     Comments: Mild generalized facial swelling    Right Ear: Drainage (yellowish) present. Tympanic membrane is erythematous.     Left Ear: Tympanic membrane is erythematous.     Ears:     Comments: Tympanostomy tube present in TM on right but in EAC on left    Nose: Congestion present.     Mouth/Throat:     Mouth: Mucous membranes are moist.     Pharynx: Oropharynx is clear. Posterior oropharyngeal erythema present.  Eyes:     General: No scleral icterus.       Right eye: No discharge.        Left eye: No discharge.     Conjunctiva/sclera: Conjunctivae normal.  Cardiovascular:     Rate and Rhythm: Regular rhythm. Tachycardia present.     Heart sounds: Normal heart sounds.  Pulmonary:     Effort: Pulmonary effort is normal. No respiratory distress.     Breath sounds: Normal breath sounds. No wheezing, rhonchi or rales.  Musculoskeletal:     Cervical back: Neck supple.  Skin:    General: Skin is dry.  Neurological:     General: No focal deficit present.     Mental Status: She is alert. Mental status is at baseline.     Motor: No weakness.     Gait: Gait normal.  Psychiatric:        Mood and Affect: Mood normal.        Behavior: Behavior normal.        Thought Content: Thought content normal.      UC Treatments / Results  Labs (all labs ordered are listed, but only abnormal results are displayed) Labs Reviewed  GROUP A STREP BY PCR  SARS CORONAVIRUS 2 BY RT PCR     EKG   Radiology No results found.  Procedures Procedures (including critical care time)  Medications Ordered in UC Medications - No data to display  Initial Impression / Assessment and Plan / UC Course  I have reviewed the triage vital signs and the nursing notes.  Pertinent labs & imaging results that were available during my care of the patient were reviewed by me and considered in my medical decision making (see chart for details).   41 year old female with long history of chronic sinusitis and previous sinus surgeries presents for 2-day history of bilateral ear pain with drainage from the right ear, nasal congestion, facial swelling and pain, sore throat, postnasal drainage and cough.  No fever.  Exposure to COVID in the last 2 weeks by her son.  Vitals are stable.  She is ill-appearing but nontoxic.  On exam she does have tympanostomy tube present in the TM on the right but in the ear canal and the left, erythema bilateral TMs, nasal congestion, erythema posterior pharynx.  Chest clear to auscultation heart regular rate and rhythm.  PCR strep test and COVID test obtained.  Negative testing.  Discussed results with patient.  Suspect acute sinusitis especially given her medical history.  We will treat at this time with Augmentin.  Also sent Atrovent nasal spray and Ciprodex since her right ear is draining.  Advised to use in right ear.  May also take Sudafed and increase rest and fluids.  Reviewed following up with her ENT specialist.   Final Clinical Impressions(s) / UC Diagnoses   Final diagnoses:  Acute sinusitis, recurrence not specified, unspecified location  Acute suppurative  otitis media of right ear without spontaneous rupture of tympanic membrane, recurrence not specified  Acute cough  Nasal congestion     Discharge Instructions      -COVID test and strep test are both negative. - You have a sinus infection and ear infection. - I have sent oral antibiotics  and antibiotic ears for the right ear - I also sent a nasal spray. - Increase rest and fluids. - Follow-up with your ENT specialist.     ED Prescriptions     Medication Sig Dispense Auth. Provider   amoxicillin-clavulanate (AUGMENTIN) 875-125 MG tablet Take 1 tablet by mouth every 12 (twelve) hours for 10 days. 20 tablet Eusebio Friendly B, PA-C   ipratropium (ATROVENT) 0.06 % nasal spray Place 2 sprays into both nostrils 4 (four) times daily. 15 mL Eusebio Friendly B, PA-C   ciprofloxacin-dexamethasone (CIPRODEX) OTIC suspension Place 4 drops into the right ear 2 (two) times daily for 7 days. 7.5 mL Shirlee Latch, PA-C      PDMP not reviewed this encounter.   Shirlee Latch, PA-C 01/07/22 1538

## 2022-01-07 NOTE — ED Triage Notes (Signed)
Patient c/o facial swelling, cough, runny nose, congestion and sore throat that started 2 days ago.  Patient states that her son had covid 2 weeks ago.

## 2022-01-07 NOTE — Discharge Instructions (Signed)
-  COVID test and strep test are both negative. - You have a sinus infection and ear infection. - I have sent oral antibiotics and antibiotic ears for the right ear - I also sent a nasal spray. - Increase rest and fluids. - Follow-up with your ENT specialist.

## 2022-01-12 ENCOUNTER — Other Ambulatory Visit: Payer: Self-pay | Admitting: Physician Assistant

## 2022-01-12 DIAGNOSIS — Z1231 Encounter for screening mammogram for malignant neoplasm of breast: Secondary | ICD-10-CM

## 2022-03-23 ENCOUNTER — Ambulatory Visit
Admission: EM | Admit: 2022-03-23 | Discharge: 2022-03-23 | Disposition: A | Discharge status: Home / Self Care | Payer: BC Managed Care – PPO | Attending: Emergency Medicine | Admitting: Emergency Medicine

## 2022-03-23 ENCOUNTER — Encounter: Payer: Self-pay | Admitting: Emergency Medicine

## 2022-03-23 DIAGNOSIS — Z792 Long term (current) use of antibiotics: Secondary | ICD-10-CM | POA: Diagnosis not present

## 2022-03-23 DIAGNOSIS — Z7952 Long term (current) use of systemic steroids: Secondary | ICD-10-CM | POA: Diagnosis not present

## 2022-03-23 DIAGNOSIS — Z1152 Encounter for screening for COVID-19: Secondary | ICD-10-CM | POA: Insufficient documentation

## 2022-03-23 DIAGNOSIS — Z79899 Other long term (current) drug therapy: Secondary | ICD-10-CM | POA: Insufficient documentation

## 2022-03-23 DIAGNOSIS — J069 Acute upper respiratory infection, unspecified: Secondary | ICD-10-CM | POA: Insufficient documentation

## 2022-03-23 LAB — RESP PANEL BY RT-PCR (RSV, FLU A&B, COVID)  RVPGX2
Influenza A by PCR: NEGATIVE
Influenza B by PCR: NEGATIVE
Resp Syncytial Virus by PCR: NEGATIVE
SARS Coronavirus 2 by RT PCR: NEGATIVE

## 2022-03-23 MED ORDER — BENZONATATE 100 MG PO CAPS: 100.0000 mg | ORAL_CAPSULE | Freq: Three times a day (TID) | ORAL | 0 refills | Status: DC

## 2022-03-23 MED ORDER — IPRATROPIUM-ALBUTEROL 0.5-2.5 (3) MG/3ML IN SOLN
3.0000 mL | Freq: Once | RESPIRATORY_TRACT | Status: AC
Start: 2022-03-23 — End: 2022-03-23
  Administered 2022-03-23: 3 mL via RESPIRATORY_TRACT

## 2022-03-23 MED ORDER — PREDNISONE 20 MG PO TABS: 40.0000 mg | ORAL_TABLET | Freq: Every day | ORAL | 0 refills | Status: DC

## 2022-03-23 MED ORDER — AMOXICILLIN-POT CLAVULANATE 875-125 MG PO TABS: 1.0000 | ORAL_TABLET | Freq: Two times a day (BID) | ORAL | 0 refills | Status: DC

## 2022-03-23 MED ORDER — METHYLPREDNISOLONE SODIUM SUCC 125 MG IJ SOLR
60.0000 mg | Freq: Once | INTRAMUSCULAR | Status: AC
  Administered 2022-03-23: 60 mg via INTRAMUSCULAR

## 2022-03-23 MED ORDER — PROMETHAZINE-DM 6.25-15 MG/5ML PO SYRP
5.0000 mL | ORAL_SOLUTION | Freq: Four times a day (QID) | ORAL | 0 refills | Status: DC | PRN
Start: 2022-03-23 — End: 2023-01-20

## 2022-03-23 NOTE — ED Provider Notes (Signed)
[facility]    CSN: [id number] Arrival date & time: 03/23/22  1810      History   Chief Complaint Chief Complaint  Patient presents with   Cough   Emesis   Shortness of Breath    HPI Crystal Cabrera is a 41 y.o. female.    Patient presents with cough, shortness of breath, wheeze, nasal congestion, rhinorrhea, fever, vomiting and sore throat beginning 4 days ago.  Vomiting has subsided, last occurrence 1 day ago.  Decreased appetite but able to tolerate fluids.  Coughing causing bilateral rib pain described as severe and urinary incontinence, having to wear pads for management.  Has general malaise and weakness.  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  History of diabetes mellitus, hypertension, vertigo.  Attempted use of rescue inhaler which has been somewhat helpful.   Past Medical History:  Diagnosis Date   Diabetes mellitus without complication (HCC)    type 1   Headache    sinus   Hypertension    Scoliosis    Tachycardia    Vertigo    with sinus congestion    There are no problems to display for this patient.   Past Surgical History:  Procedure Laterality Date   ANKLE SURGERY     CESAREAN SECTION     ETHMOIDECTOMY Bilateral 06/23/2020   Procedure: ETHMOIDECTOMY;  Surgeon: Bud Face, MD;  Location: [facility];  Service: ENT;  Laterality: Bilateral;   IMAGE GUIDED SINUS SURGERY N/A 06/23/2020   Procedure: IMAGE GUIDED SINUS SURGERY;  Surgeon: Bud Face, MD;  Location: [facility];  Service: ENT;  Laterality: N/A;  placed disk on OR CHARGE nurse desk 3-10-kp   leg sugery  rt     MAXILLARY ANTROSTOMY Bilateral 06/23/2020   Procedure: MAXILLARY ANTROSTOMY with tissue;  Surgeon: Bud Face, MD;  Location: [facility];  Service: ENT;  Laterality: Bilateral;   MYRINGOTOMY WITH TUBE PLACEMENT  Bilateral 06/23/2020   Procedure: MYRINGOTOMY WITH TUBE PLACEMENT;  Surgeon: Bud Face, MD;  Location: [facility];  Service: ENT;  Laterality: Bilateral;  Diabetic - insulin   NASAL SEPTOPLASTY W/ TURBINOPLASTY Bilateral 06/23/2020   Procedure: NASAL SEPTOPLASTY WITH TURBINATE REDUCTION BALLOON;  Surgeon: Bud Face, MD;  Location: [facility];  Service: ENT;  Laterality: Bilateral;   SPHENOIDECTOMY Bilateral 06/23/2020   Procedure: Selina Cooley;  Surgeon: Bud Face, MD;  Location: [facility];  Service: ENT;  Laterality: Bilateral;    OB History   No obstetric history on file.      Home Medications    Prior to Admission medications   Medication Sig Start Date End Date Taking? Authorizing Provider  amLODipine (NORVASC) 5 MG tablet Take 1 tablet (5 mg total) by mouth daily. 09/15/19   Tommie Sams, DO  atorvastatin (LIPITOR) 10 MG tablet Take 10 mg by mouth daily. 04/25/20   [provider]  azelastine (ASTELIN) 0.1 % nasal spray SMARTSIG:1-2 Spray(s) Both Nares Every 12 Hours PRN 05/18/20   [provider]  carvedilol (COREG) 12.5 MG tablet Take 1 tablet (12.5 mg total) by mouth 2 (two) times daily with a meal. 09/15/19   Cook, Dorie Rank G, DO  cloNIDine (CATAPRES) 0.1 MG tablet TAKE 1 TABLET (0.1 MG TOTAL) BY MOUTH 2 (TWO) TIMES DAILY 09/15/19   Cook, Jayce G, DO  fluticasone (FLONASE) 50 MCG/ACT nasal spray Place 2 sprays into  both nostrils daily. 05/18/20   [provider]  insulin aspart (FIASP FLEXTOUCH) 100 UNIT/ML FlexTouch Pen Inject 34 Units into the skin with breakfast, with lunch, and with evening meal. 09/15/19   Cook, Dorie Rank G, DO  ipratropium (ATROVENT) 0.06 % nasal spray Place 2 sprays into both nostrils 4 (four) times daily. 01/07/22   Eusebio Friendly B, PA-C  lisinopril (ZESTRIL) 10 MG tablet Take 1 tablet (10 mg total) by mouth daily. 09/15/19   Tommie Sams, DO  losartan (COZAAR) 50 MG tablet Take 50 mg by mouth daily.  12/28/21   [provider]  NOVOLOG FLEXPEN 100 UNIT/ML FlexPen SMARTSIG:34 Unit(s) SUB-Q Twice Daily 09/20/20   [provider]  TRESIBA FLEXTOUCH 200 UNIT/ML FlexTouch Pen Inject 70 Units into the skin daily. 03/25/20   [provider]    Family History Family History  Problem Relation Age of Onset   Healthy Mother    Healthy Father     Social History Social History   Tobacco Use   Smoking status: Never   Smokeless tobacco: Never  Vaping Use   Vaping Use: Never used  Substance Use Topics   Alcohol use: Not Currently   Drug use: Never     Allergies   Patient has no known allergies.   Review of Systems Review of Systems  Constitutional:  Positive for fever. Negative for activity change, appetite change, chills, diaphoresis, fatigue and unexpected weight change.  HENT:  Positive for congestion, rhinorrhea and sore throat. Negative for dental problem, drooling, ear discharge, ear pain, facial swelling, hearing loss, mouth sores, nosebleeds, postnasal drip, sinus pressure, sinus pain, sneezing, tinnitus, trouble swallowing and voice change.   Respiratory:  Positive for cough, shortness of breath and wheezing. Negative for apnea, choking, chest tightness and stridor.   Cardiovascular: Negative.   Gastrointestinal:  Positive for vomiting. Negative for abdominal distention, abdominal pain, anal bleeding, blood in stool, constipation, diarrhea, nausea and rectal pain.  Skin: Negative.   Neurological: Negative.      Physical Exam Triage Vital Signs ED Triage Vitals  Enc Vitals Group     BP 03/23/22 1903 (!) 119/56     Pulse Rate 03/23/22 1903 (!) 105     Resp 03/23/22 1903 16     Temp 03/23/22 1903 99.8 F (37.7 C)     Temp Source 03/23/22 1903 Oral     SpO2 03/23/22 1903 95 %     Weight --      Height --      Head Circumference --      Peak Flow --      Pain Score 03/23/22 1901 8     Pain Loc --      Pain Edu? --      Excl. in GC? --     No data found.  Updated Vital Signs BP (!) 119/56 (BP Location: Left Arm)   Pulse (!) 105   Temp 99.8 F (37.7 C) (Oral)   Resp 16   LMP 03/02/2022 (Approximate)   SpO2 95%   Visual Acuity Right Eye Distance:   Left Eye Distance:   Bilateral Distance:    Right Eye Near:   Left Eye Near:    Bilateral Near:     Physical Exam Constitutional:      Appearance: Normal appearance.  HENT:     Head: Normocephalic.     Right Ear: Tympanic membrane, ear canal and external ear normal.     Left Ear: Tympanic membrane, ear  canal and external ear normal.     Nose: Congestion and rhinorrhea present.     Mouth/Throat:     Mouth: Mucous membranes are moist.     Pharynx: Posterior oropharyngeal erythema present.  Eyes:     Extraocular Movements: Extraocular movements intact.  Cardiovascular:     Rate and Rhythm: Normal rate and regular rhythm.     Pulses: Normal pulses.     Heart sounds: Normal heart sounds.  Pulmonary:     Effort: Pulmonary effort is normal.     Breath sounds: Normal breath sounds.     Comments: Congested persistent cough witnessed Chest:     Comments: Tenderness generalized to chest wall, clinical chest wall symmetrical Musculoskeletal:        General: Normal range of motion.     Cervical back: Normal range of motion and neck supple.  Skin:    General: Skin is warm and dry.  Neurological:     Mental Status: She is alert and oriented to person, place, and time. Mental status is at baseline.  Psychiatric:        Mood and Affect: Mood normal.        Behavior: Behavior normal.      UC Treatments / Results  Labs (all labs ordered are listed, but only abnormal results are displayed) Labs Reviewed  RESP PANEL BY RT-PCR (RSV, FLU A&B, COVID)  [id number]    EKG   Radiology No results found.  Procedures Procedures (including critical care time)  Medications Ordered in UC Medications - No data to display  Initial Impression / Assessment and Plan / UC  Course  I have reviewed the triage vital signs and the nursing notes.  Pertinent labs & imaging results that were available during my care of the patient were reviewed by me and considered in my medical decision making (see chart for details).  Acute upper respiratory infection  Vital signs are stable and while patient is ill-appearing she is in no signs of distress nor toxic appearing, labored breathing is noted on initial exam however lungs are clear to auscultation and O2 saturation is at 95%, DuoNebs and methylprednisolone injection given in office and on reevaluation, exertion is minimized and patient endorses that she is feeling somewhat better, prescribed Augmentin, Tessalon, prednisone and Promethazine DM for outpatient use, COVID, flu and RSV testing negative, may use additional over-the-counter medications as needed, given strict precautions that if symptoms worsen to follow-up for reevaluation Final Clinical Impressions(s) / UC Diagnoses   Final diagnoses:  None   Discharge Instructions   None    ED Prescriptions   None    PDMP not reviewed this encounter.   Valinda Hoar, [place] 03/31/22 [phone]

## 2022-03-23 NOTE — Discharge Instructions (Addendum)
Your symptoms today are most likely being caused by a virus and should steadily improve in time it can take up to 7 to 10 days before you truly start to see a turnaround however things will get better  COVID, flu and RSV testing negative  Due to history will be started on antibiotic prophylactically, take Augmentin every morning and every evening for 7 days  Lungs are clear when listening to and you are getting enough air without assistance therefore x-ray imaging has been deferred  Starting tomorrow begin prednisone every morning with food for 5 days to help relax the airway  You may use Tessalon pill every 8 hours to help calm your coughing  You may use cough syrup every 6 hours as needed for additional comfort    You can take Tylenol and/or Ibuprofen as needed for fever reduction and pain relief.   For cough: honey 1/2 to 1 teaspoon (you can dilute the honey in water or another fluid).  You can also use guaifenesin and dextromethorphan for cough. You can use a humidifier for chest congestion and cough.  If you don't have a humidifier, you can sit in the bathroom with the hot shower running.      For sore throat: try warm salt water gargles, cepacol lozenges, throat spray, warm tea or water with lemon/honey, popsicles or ice, or OTC cold relief medicine for throat discomfort.   For congestion: take a daily anti-histamine like Zyrtec, Claritin, and a oral decongestant, such as pseudoephedrine.  You can also use Flonase 1-2 sprays in each nostril daily.   It is important to stay hydrated: drink plenty of fluids (water, gatorade/powerade/pedialyte, juices, or teas) to keep your throat moisturized and help further relieve irritation/discomfort.

## 2022-03-23 NOTE — ED Triage Notes (Signed)
Pt presents with cough, chest congestion, SOB and vomiting x 4 days. Pt states she felt like she was going to pass out.

## 2023-01-20 ENCOUNTER — Ambulatory Visit
Admission: EM | Admit: 2023-01-20 | Discharge: 2023-01-20 | Disposition: A | Discharge status: Home / Self Care | Payer: BC Managed Care – PPO | Attending: Internal Medicine | Admitting: Internal Medicine

## 2023-01-20 ENCOUNTER — Encounter: Payer: Self-pay | Admitting: Emergency Medicine

## 2023-01-20 DIAGNOSIS — B9789 Other viral agents as the cause of diseases classified elsewhere: Secondary | ICD-10-CM | POA: Insufficient documentation

## 2023-01-20 DIAGNOSIS — Z1152 Encounter for screening for COVID-19: Secondary | ICD-10-CM | POA: Diagnosis not present

## 2023-01-20 DIAGNOSIS — J029 Acute pharyngitis, unspecified: Secondary | ICD-10-CM | POA: Diagnosis present

## 2023-01-20 DIAGNOSIS — R051 Acute cough: Secondary | ICD-10-CM | POA: Diagnosis present

## 2023-01-20 DIAGNOSIS — J069 Acute upper respiratory infection, unspecified: Secondary | ICD-10-CM | POA: Insufficient documentation

## 2023-01-20 LAB — GROUP A STREP BY PCR: Group A Strep by PCR: NOT DETECTED

## 2023-01-20 LAB — SARS CORONAVIRUS 2 BY RT PCR: SARS Coronavirus 2 by RT PCR: NEGATIVE

## 2023-01-20 MED ORDER — IPRATROPIUM BROMIDE 0.06 % NA SOLN: 2.0000 | Freq: Four times a day (QID) | NASAL | 0 refills | Status: AC

## 2023-01-20 MED ORDER — PROMETHAZINE-DM 6.25-15 MG/5ML PO SYRP: 5.0000 mL | ORAL_SOLUTION | Freq: Four times a day (QID) | ORAL | 0 refills | Status: AC | PRN

## 2023-01-20 NOTE — ED Triage Notes (Signed)
Patient c/o sore throat, cough, sinus congestion and pressure and bodyaches that strared 2 days ago.  Patient denies fevers.

## 2023-01-20 NOTE — Discharge Instructions (Signed)
URI/COLD SYMPTOMS: Your exam today is consistent with a viral illness. Antibiotics are not indicated at this time. Use medications as directed, including cough syrup, nasal saline, and decongestants. Your symptoms should improve over the next few days and resolve within 7-10 days. Increase rest and fluids. F/u if symptoms worsen or predominate such as sore throat, ear pain, productive cough, shortness of breath, or if you develop high fevers or worsening fatigue over the next several days.    

## 2023-01-20 NOTE — ED Provider Notes (Signed)
[facility]    CSN: [id number] Arrival date & time: 01/20/23  1110      History   Chief Complaint Chief Complaint  Patient presents with   Sinus Problem   Cough   Sore Throat    HPI Kia Varnadore is a 42 y.o. female presenting for 2-day history of sore throat, cough, congestion, fatigue and bodyaches.  Denies fever but says "I never get fevers."  No chest pain or shortness of breath.  Patient says her husband has had similar symptoms.  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  She has been taking Mucinex over-the-counter.  Has an inhaler at home if she needs it.  Medical history significant for type 1 diabetes, hypertension.  Has had sinus surgeries in the past.  HPI  Past Medical History:  Diagnosis Date   Diabetes mellitus without complication (HCC)    type 1   Headache    sinus   Hypertension    Scoliosis    Tachycardia    Vertigo    with sinus congestion    There are no problems to display for this patient.   Past Surgical History:  Procedure Laterality Date   ANKLE SURGERY     CESAREAN SECTION     ETHMOIDECTOMY Bilateral 06/23/2020   Procedure: ETHMOIDECTOMY;  Surgeon: Bud Face, MD;  Location: [facility];  Service: ENT;  Laterality: Bilateral;   IMAGE GUIDED SINUS SURGERY N/A 06/23/2020   Procedure: IMAGE GUIDED SINUS SURGERY;  Surgeon: Bud Face, MD;  Location: [facility];  Service: ENT;  Laterality: N/A;  placed disk on OR CHARGE nurse desk 3-10-kp   leg sugery  rt     MAXILLARY ANTROSTOMY Bilateral 06/23/2020   Procedure: MAXILLARY ANTROSTOMY with tissue;  Surgeon: Bud Face, MD;  Location: [facility];  Service: ENT;  Laterality: Bilateral;   MYRINGOTOMY WITH TUBE PLACEMENT Bilateral 06/23/2020   Procedure: MYRINGOTOMY WITH TUBE PLACEMENT;  Surgeon: Bud Face, MD;  Location: [facility];  Service: ENT;  Laterality: Bilateral;  Diabetic - insulin    NASAL SEPTOPLASTY W/ TURBINOPLASTY Bilateral 06/23/2020   Procedure: NASAL SEPTOPLASTY WITH TURBINATE REDUCTION BALLOON;  Surgeon: Bud Face, MD;  Location: [facility];  Service: ENT;  Laterality: Bilateral;   SPHENOIDECTOMY Bilateral 06/23/2020   Procedure: Selina Cooley;  Surgeon: Bud Face, MD;  Location: [facility];  Service: ENT;  Laterality: Bilateral;    OB History   No obstetric history on file.      Home Medications    Prior to Admission medications   Medication Sig Start Date End Date Taking? Authorizing Provider  amLODipine (NORVASC) 5 MG tablet Take 1 tablet (5 mg total) by mouth daily. 09/15/19  Yes Cook, Jayce G, DO  atorvastatin (LIPITOR) 10 MG tablet Take 10 mg by mouth daily. 04/25/20  Yes [provider]  carvedilol (COREG) 12.5 MG tablet Take 1 tablet (12.5 mg total) by mouth 2 (two) times daily with a meal. 09/15/19  Yes Cook, Jayce G, DO  ipratropium (ATROVENT) 0.06 % nasal spray Place 2 sprays into both nostrils 4 (four) times daily. 01/20/23  Yes Shirlee Latch, PA-C  promethazine-dextromethorphan (PROMETHAZINE-DM) 6.25-15 MG/5ML syrup Take 5 mLs by mouth 4 (four) times daily as needed. 01/20/23  Yes Eusebio Friendly B, PA-C  TRESIBA FLEXTOUCH 200 UNIT/ML FlexTouch Pen Inject 70 Units into the skin daily. 03/25/20  Yes [provider]  azelastine (ASTELIN) 0.1 % nasal spray SMARTSIG:1-2 Spray(s) Both Nares  Every 12 Hours PRN 05/18/20   [provider]  cloNIDine (CATAPRES) 0.1 MG tablet TAKE 1 TABLET (0.1 MG TOTAL) BY MOUTH 2 (TWO) TIMES DAILY 09/15/19   Cook, Jayce G, DO  fluticasone (FLONASE) 50 MCG/ACT nasal spray Place 2 sprays into both nostrils daily. 05/18/20   [provider]  insulin aspart (FIASP FLEXTOUCH) 100 UNIT/ML FlexTouch Pen Inject 34 Units into the skin with breakfast, with lunch, and with evening meal. 09/15/19   Cook, Dorie Rank G, DO  lisinopril (ZESTRIL) 10 MG tablet Take 1 tablet (10 mg total) by  mouth daily. 09/15/19   Tommie Sams, DO  losartan (COZAAR) 50 MG tablet Take 50 mg by mouth daily. 12/28/21   [provider]  NOVOLOG FLEXPEN 100 UNIT/ML FlexPen SMARTSIG:34 Unit(s) SUB-Q Twice Daily 09/20/20   [provider]    Family History Family History  Problem Relation Age of Onset   Healthy Mother    Healthy Father     Social History Social History   Tobacco Use   Smoking status: Never   Smokeless tobacco: Never  Vaping Use   Vaping status: Never Used  Substance Use Topics   Alcohol use: Not Currently   Drug use: Never     Allergies   Patient has no known allergies.   Review of Systems Review of Systems  Constitutional:  Positive for fatigue. Negative for chills, diaphoresis and fever.  HENT:  Positive for congestion, rhinorrhea, sinus pressure and sore throat. Negative for ear pain and sinus pain.   Respiratory:  Positive for cough. Negative for shortness of breath.   Cardiovascular:  Negative for chest pain.  Gastrointestinal:  Negative for abdominal pain, nausea and vomiting.  Musculoskeletal:  Positive for myalgias.  Skin:  Negative for rash.  Neurological:  Positive for headaches. Negative for weakness.  Hematological:  Negative for adenopathy.     Physical Exam Triage Vital Signs ED Triage Vitals  Encounter Vitals Group     BP 01/20/23 1142 131/81     Systolic BP Percentile --      Diastolic BP Percentile --      Pulse Rate 01/20/23 1142 (!) 105     Resp 01/20/23 1142 14     Temp 01/20/23 1142 98.8 F (37.1 C)     Temp Source 01/20/23 1142 Oral     SpO2 01/20/23 1142 97 %     Weight 01/20/23 1139 214 lb 15.2 oz (97.5 kg)     Height 01/20/23 1139 5\' 5"  (1.651 m)     Head Circumference --      Peak Flow --      Pain Score 01/20/23 1139 8     Pain Loc --      Pain Education --      Exclude from Growth Chart --    No data found.  Updated Vital Signs BP 131/81 (BP Location: Right Arm)   Pulse (!) 105   Temp 98.8 F  (37.1 C) (Oral)   Resp 14   Ht 5\' 5"  (1.651 m)   Wt 214 lb 15.2 oz (97.5 kg)   LMP 01/06/2023 (Approximate)   SpO2 97%   BMI 35.77 kg/m     Physical Exam Vitals and nursing note reviewed.  Constitutional:      General: She is not in acute distress.    Appearance: Normal appearance. She is not ill-appearing or toxic-appearing.  HENT:     Head: Normocephalic and atraumatic.     Right Ear: Tympanic  membrane, ear canal and external ear normal.     Left Ear: Tympanic membrane, ear canal and external ear normal.     Ears:     Comments: Tympanostomy tube in right ear    Nose: Congestion present.     Mouth/Throat:     Mouth: Mucous membranes are moist.     Pharynx: Oropharynx is clear. Posterior oropharyngeal erythema present.  Eyes:     General: No scleral icterus.       Right eye: No discharge.        Left eye: No discharge.     Conjunctiva/sclera: Conjunctivae normal.  Cardiovascular:     Rate and Rhythm: Normal rate and regular rhythm.     Heart sounds: Normal heart sounds.  Pulmonary:     Effort: Pulmonary effort is normal. No respiratory distress.     Breath sounds: Normal breath sounds.  Musculoskeletal:     Cervical back: Neck supple.  Skin:    General: Skin is dry.  Neurological:     General: No focal deficit present.     Mental Status: She is alert. Mental status is at baseline.     Motor: No weakness.     Gait: Gait normal.  Psychiatric:        Mood and Affect: Mood normal.        Behavior: Behavior normal.        Thought Content: Thought content normal.      UC Treatments / Results  Labs (all labs ordered are listed, but only abnormal results are displayed) Labs Reviewed  SARS CORONAVIRUS 2 BY RT PCR  GROUP A STREP BY PCR    EKG   Radiology No results found.  Procedures Procedures (including critical care time)  Medications Ordered in UC Medications - No data to display  Initial Impression / Assessment and Plan / UC Course  I have  reviewed the triage vital signs and the nursing notes.  Pertinent labs & imaging results that were available during my care of the patient were reviewed by me and considered in my medical decision making (see chart for details).   42 year old female presents for 2-day history of fatigue, body aches, cough, congestion, sore throat, sinus pressure.  No fever or breathing difficulty.  Has been has similar symptoms.  She is afebrile and overall well-appearing.  No acute distress.  On exam is nasal congestion.  No evidence of an ear infection.  Mild posterior pharyngeal erythema.  Chest clear in 4 listening fields.  Strep and COVID testing negative.  Reviewed these results with patient.  Likely viral URI.  Supportive care encouraged with increasing rest and fluids.  Sent Promethazine DM and Atrovent nasal spray to pharmacy.  Encouraged her to use inhaler if needed.  Reviewed returning for fever or worsening symptoms or if she is not better after 2 weeks.   Final Clinical Impressions(s) / UC Diagnoses   Final diagnoses:  Viral upper respiratory tract infection  Acute cough  Sore throat     Discharge Instructions      URI/COLD SYMPTOMS: Your exam today is consistent with a viral illness. Antibiotics are not indicated at this time. Use medications as directed, including cough syrup, nasal saline, and decongestants. Your symptoms should improve over the next few days and resolve within 7-10 days. Increase rest and fluids. F/u if symptoms worsen or predominate such as sore throat, ear pain, productive cough, shortness of breath, or if you develop high fevers or worsening fatigue over the  next several days.       ED Prescriptions     Medication Sig Dispense Auth. Provider   promethazine-dextromethorphan (PROMETHAZINE-DM) 6.25-15 MG/5ML syrup Take 5 mLs by mouth 4 (four) times daily as needed. 118 mL Eusebio Friendly B, PA-C   ipratropium (ATROVENT) 0.06 % nasal spray Place 2 sprays into both  nostrils 4 (four) times daily. 15 mL Shirlee Latch, PA-C      PDMP not reviewed this encounter.   Shirlee Latch, PA-C 01/20/23 1231

## 2023-02-05 IMAGING — CT CT VENOGRAM HEAD
3 of 7 series · 16 of 47 positions shown · IV contrast (APPLIED)
Comparison: MRI brain with contrast May 25, 2020.

CLINICAL DATA: Dural sinus thrombosis suspected.

EXAM:
CT VENOGRAM HEAD
TECHNIQUE: Multidetector CT imaging of the head was performed using the
standard protocol during bolus administration of intravenous
contrast. Multiplanar CT image reconstructions and MIPs were
obtained to evaluate the vascular anatomy.
CONTRAST:  75mL OMNIPAQUE IOHEXOL 350 MG/ML SOLN

[Series 3: ax thin · axial · 0.37mm/px · z∈[-173,-29]mm · 10 of 164 slices shown]
[im 10/164  brain]
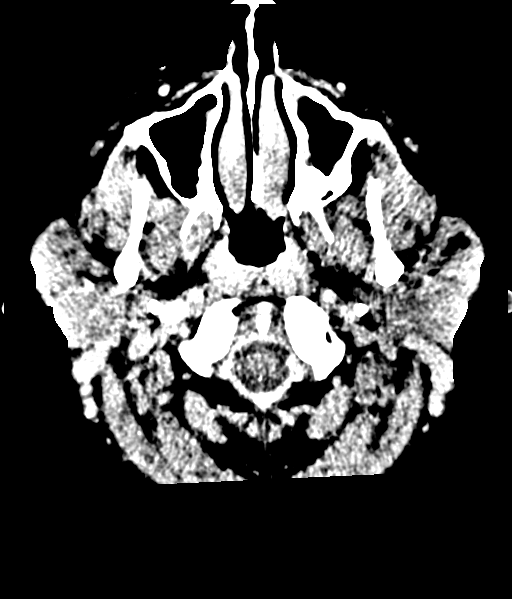
[im 29/164  bone]
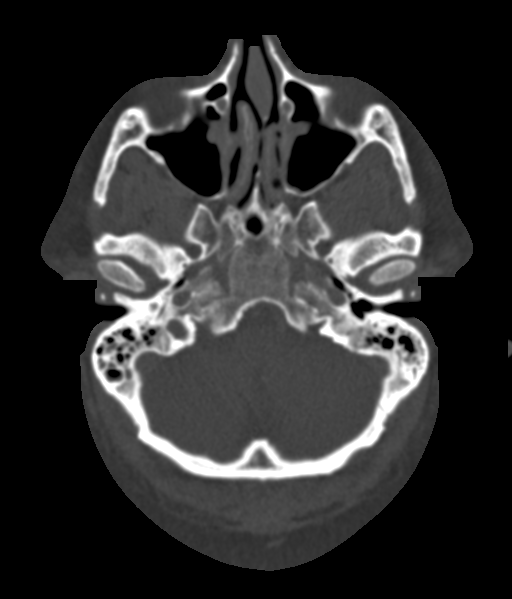
[im 48/164  brain]
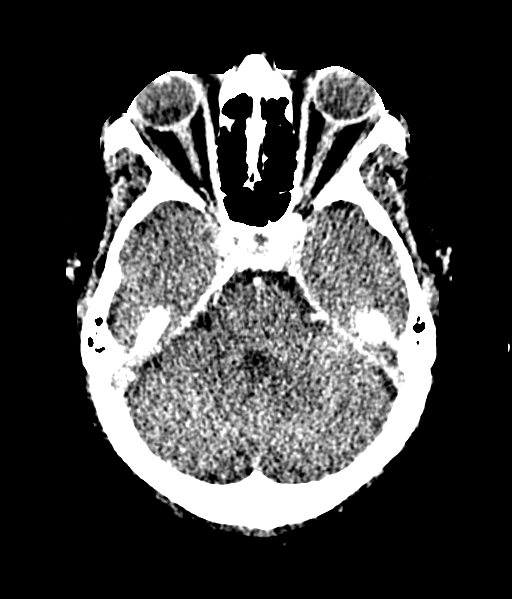
[im 58/164  bone]
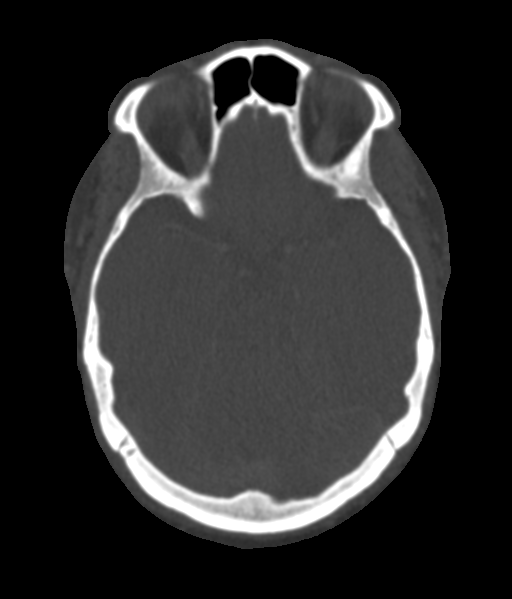
[im 77/164  brain]
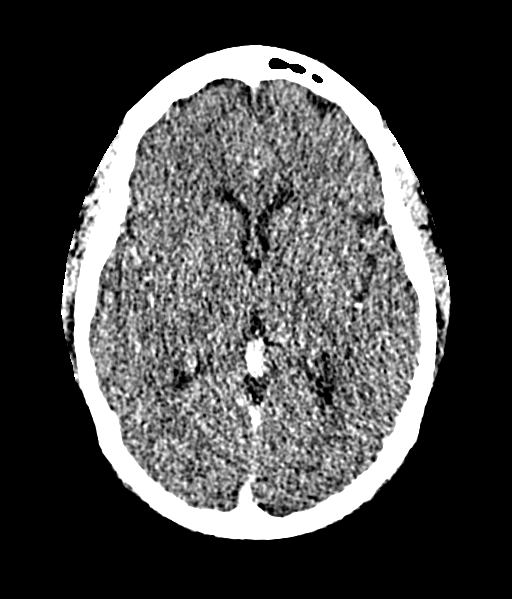
[im 87/164  bone]
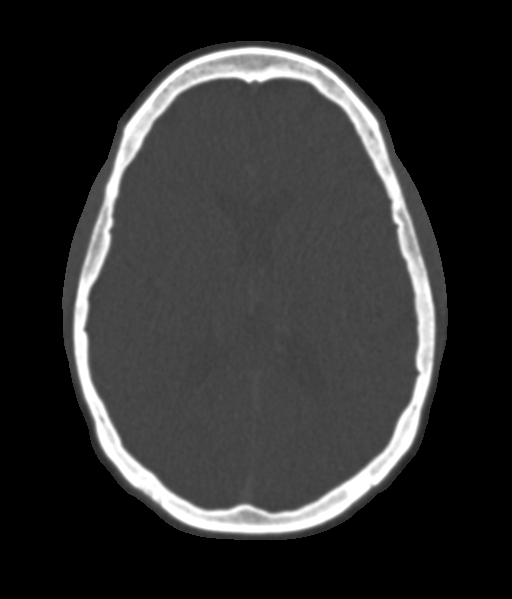
[im 106/164  brain]
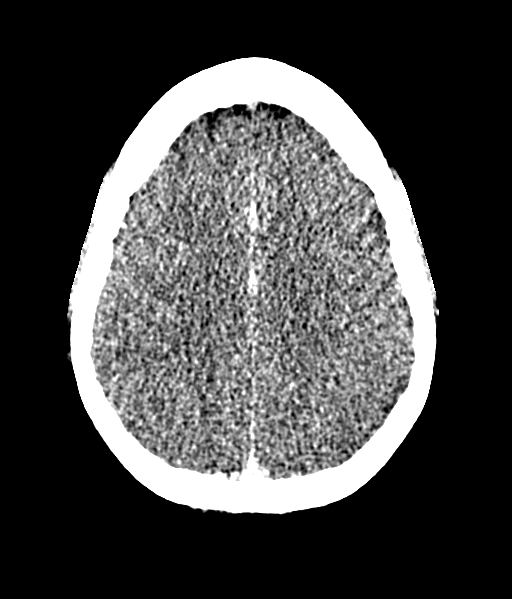
[im 125/164  bone]
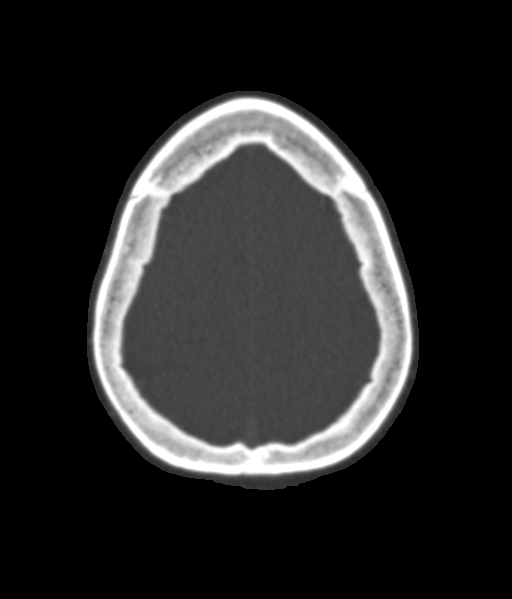
[im 135/164  brain]
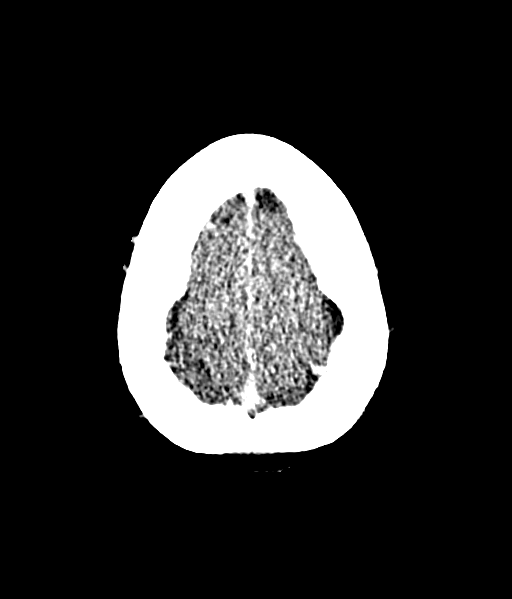
[im 154/164  bone]
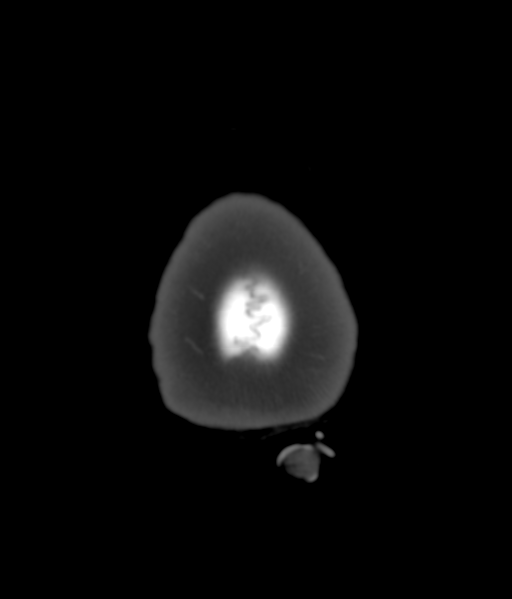

[Series 5: cor thin · coronal · 0.32mm/px · 3 of 221 slices shown]
[im 63/221  brain]
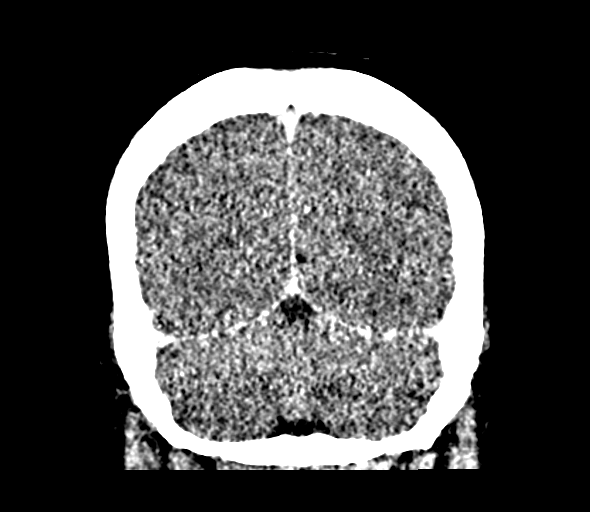
[im 95/221  brain]
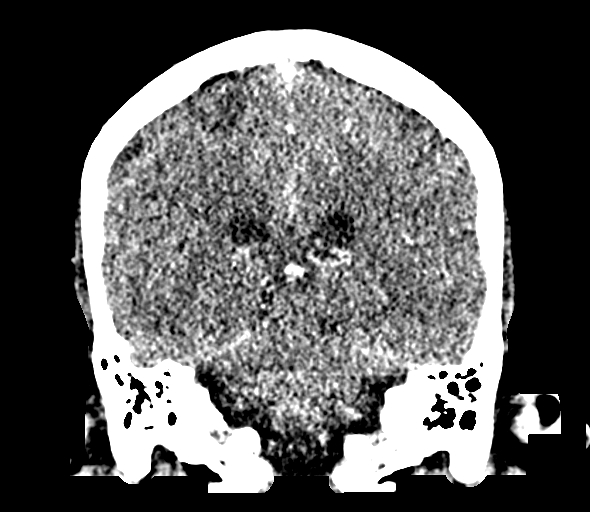
[im 126/221  brain]
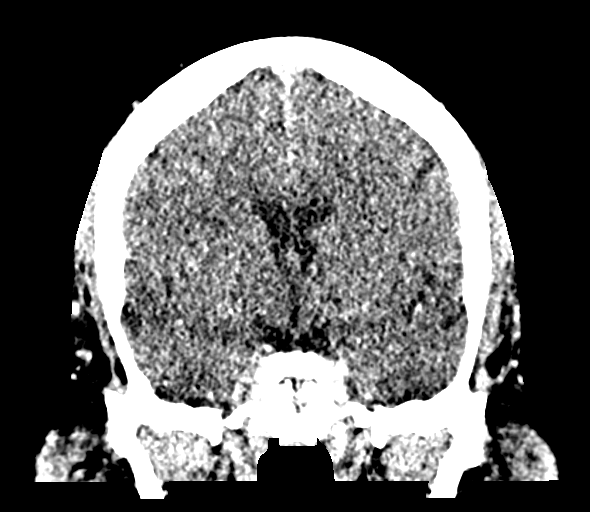

[Series 7: sag thin · sagittal · 0.32mm/px · 3 of 190 slices shown]
[im 38/190  brain]
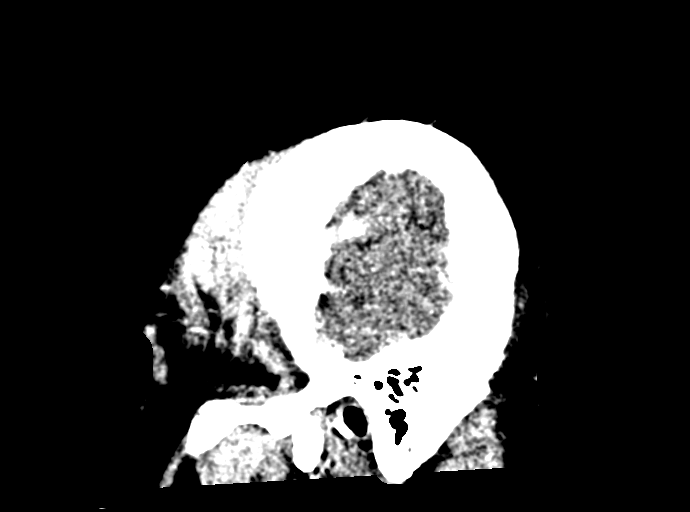
[im 76/190  brain]
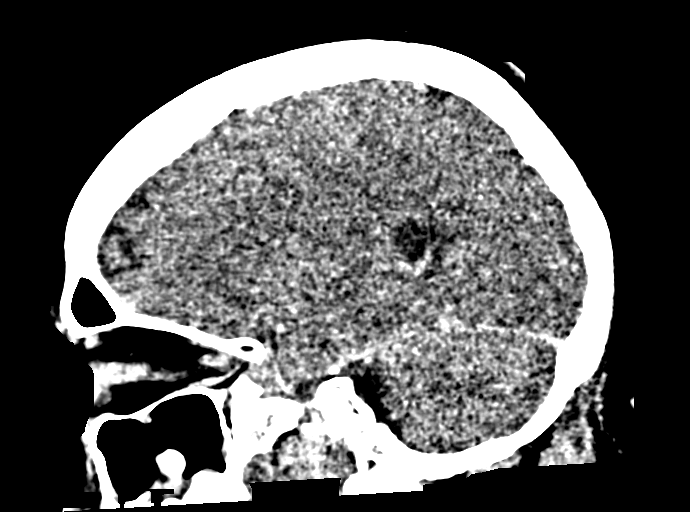
[im 114/190  brain]
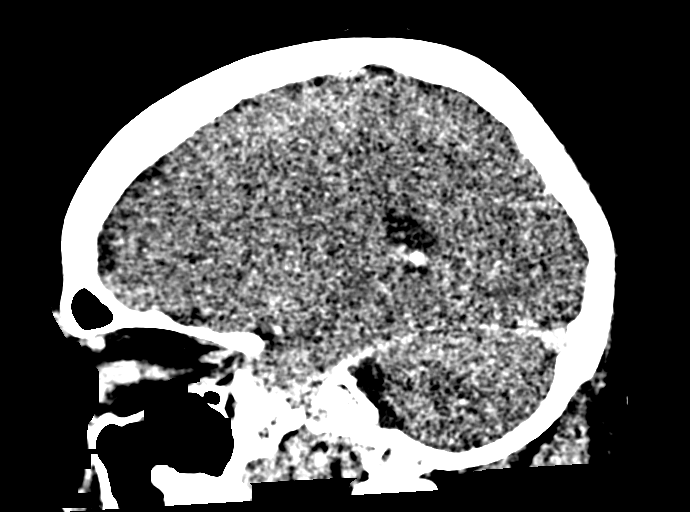

[16 of 47 positions shown; findings below may reference images not displayed]

FINDINGS: No visible dural sinus thrombosis. The left transverse/sigmoid
sinuses are small. Apparent filling defect in the distal left
transverse sinus is consistent with an arachnoid granulation when
correlating with recent MRI brain with contrast

Please see concurrent CT head for intracranial non-venous
evaluation.
IMPRESSION: No evidence of dural sinus thrombosis.

## 2023-02-05 IMAGING — CT CT HEAD W/O CM
4 series · 16 of 47 positions shown, 18 images · non-contrast
Comparison: Brain MRI 05/25/2020.

CLINICAL DATA: Transient ischemic attack (TIA). Additional history
provided: Patient reports numbness around lips and nose which began
this morning upon awakening, headache (severe).

EXAM:
CT HEAD WITHOUT CONTRAST
TECHNIQUE: Contiguous axial images were obtained from the base of the skull
through the vertex without intravenous contrast.

[Series 2: head wo · axial · 0.45mm/px · z∈[-136,-26]mm · 7 of 30 slices shown, 9 images]
[im 4/30  brain]
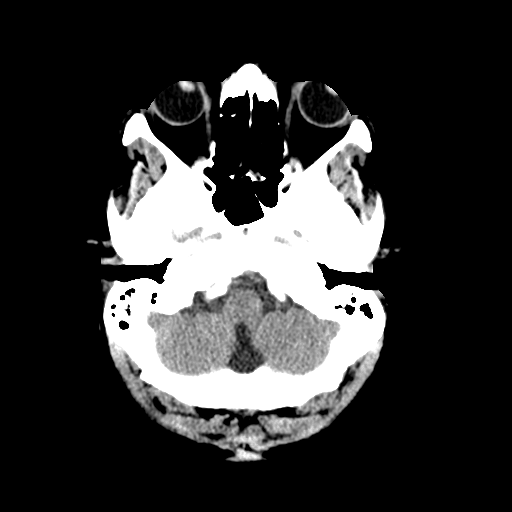
[im 4/30  bone]
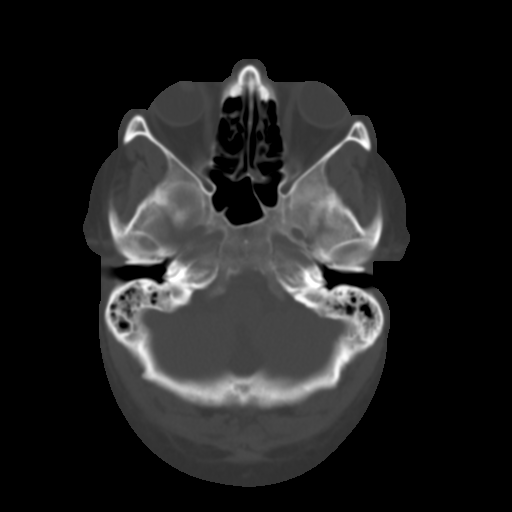
[im 8/30  brain]
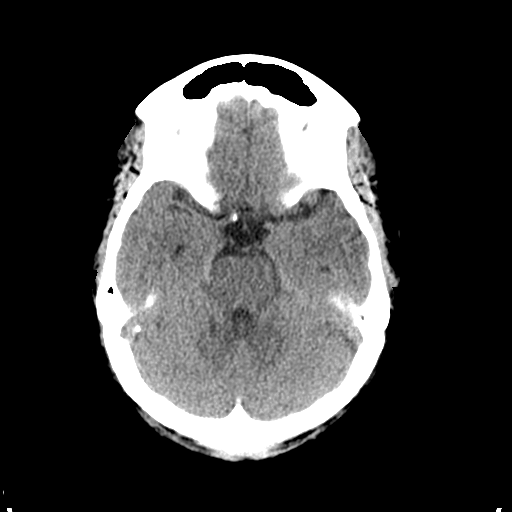
[im 11/30  brain]
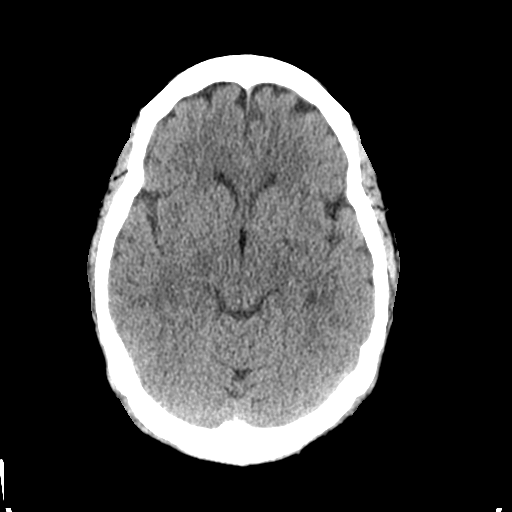
[im 15/30  brain]
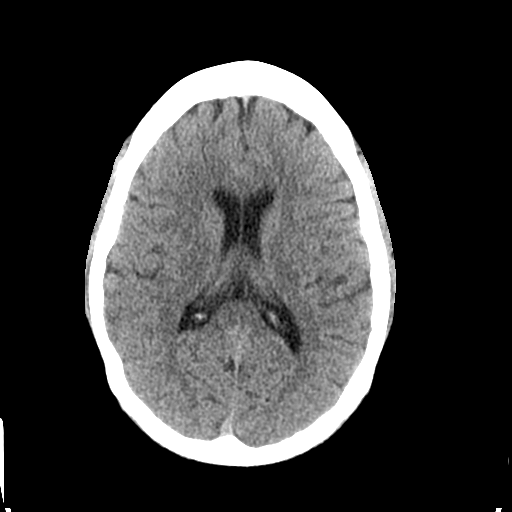
[im 19/30  brain]
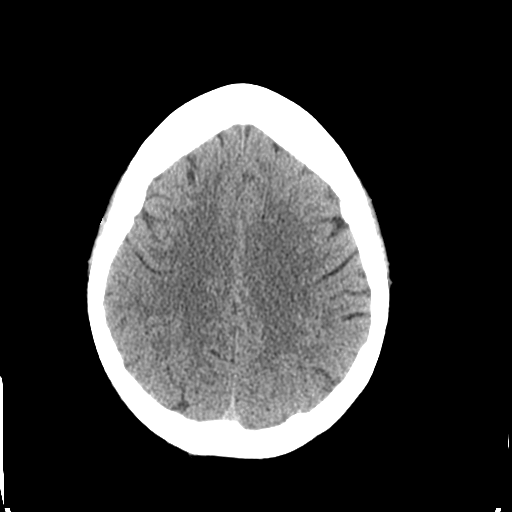
[im 19/30  bone]
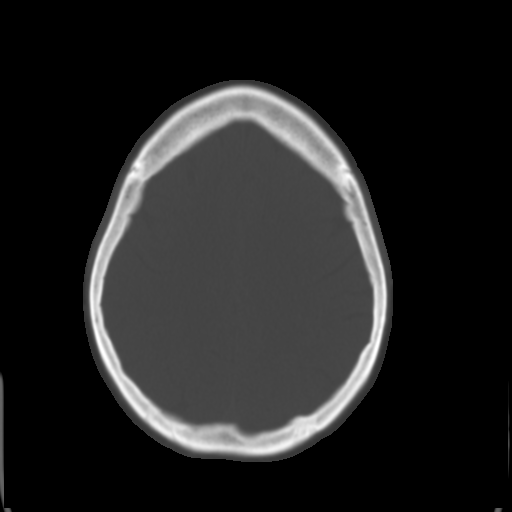
[im 22/30  brain]
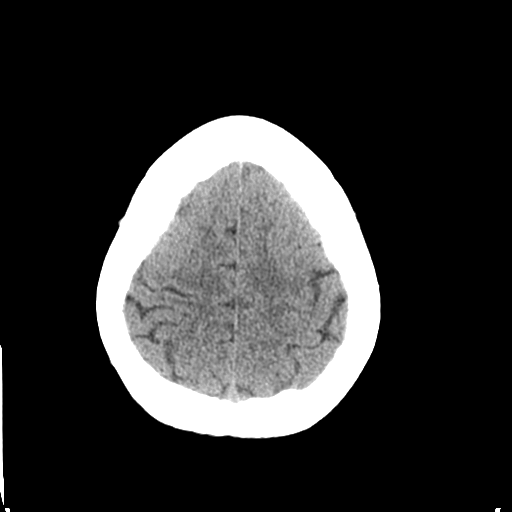
[im 26/30  brain]
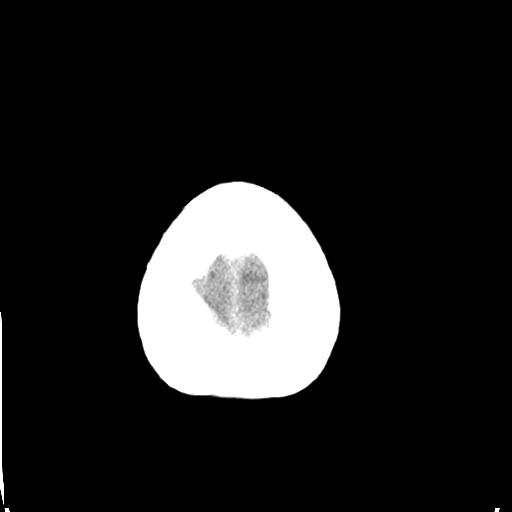

[Series 3: head bone · axial · 0.45mm/px · z∈[-137,-107]mm · 3 of 75 slices shown]
[im 8/75  bone]
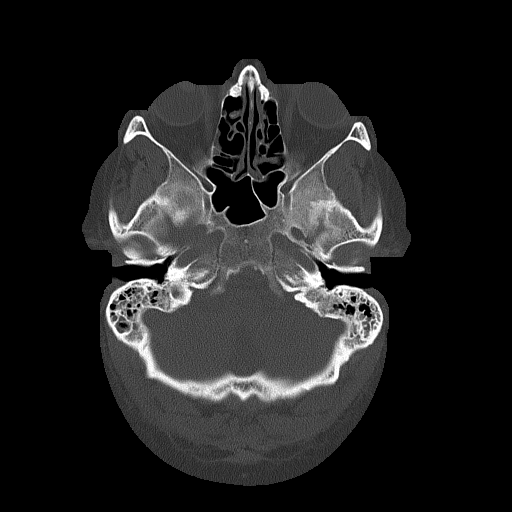
[im 15/75  bone]
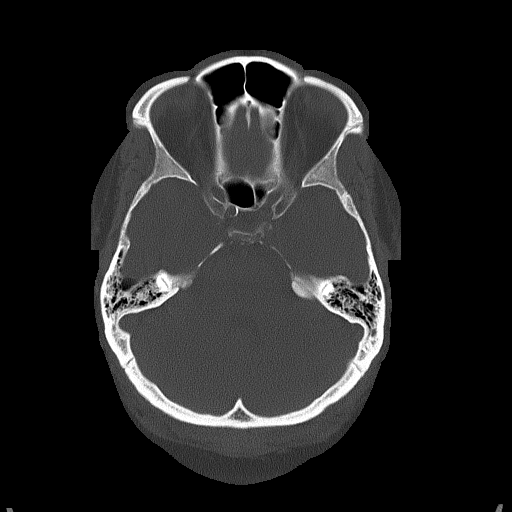
[im 23/75  bone]
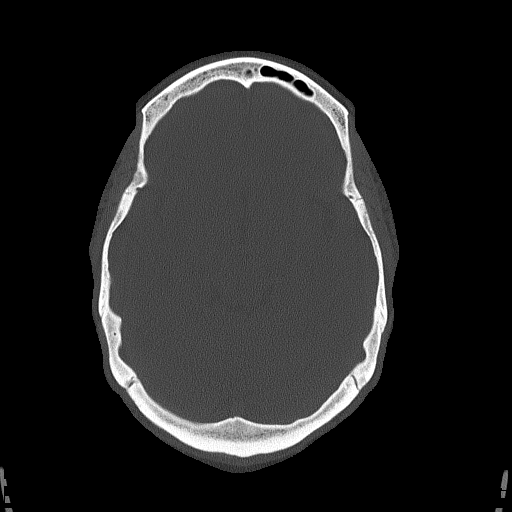

[Series 4: coronal soft tissue · coronal · 0.30mm/px · 3 of 66 slices shown]
[im 22/66  brain]
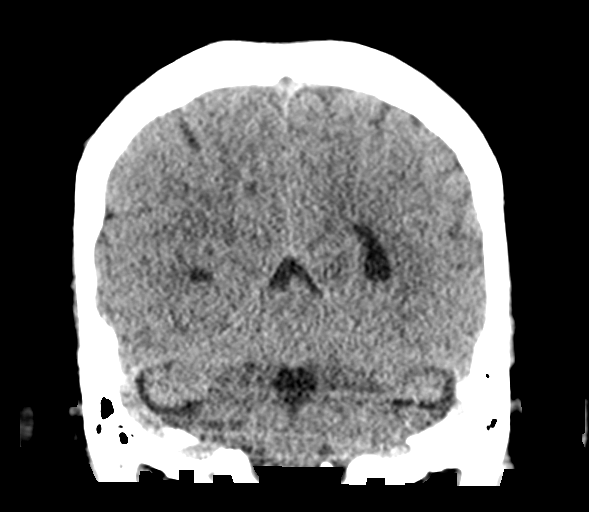
[im 29/66  brain]
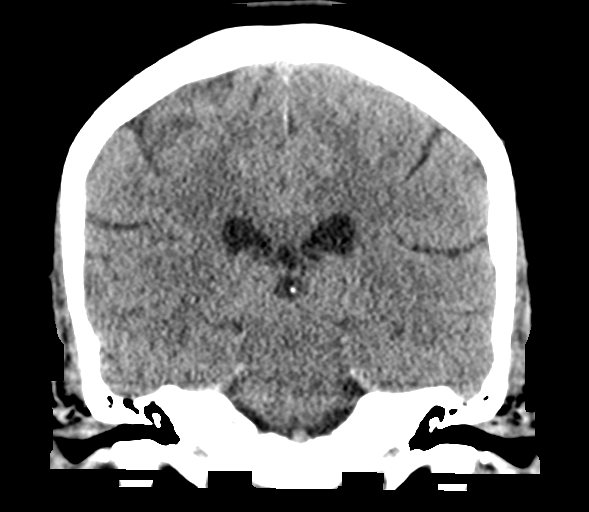
[im 37/66  brain]
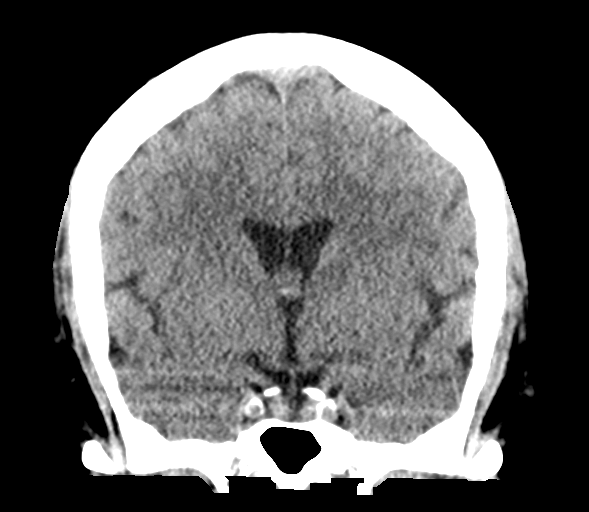

[Series 5: sagittal soft tissue · sagittal · 0.30mm/px · 3 of 59 slices shown]
[im 20/59  brain]
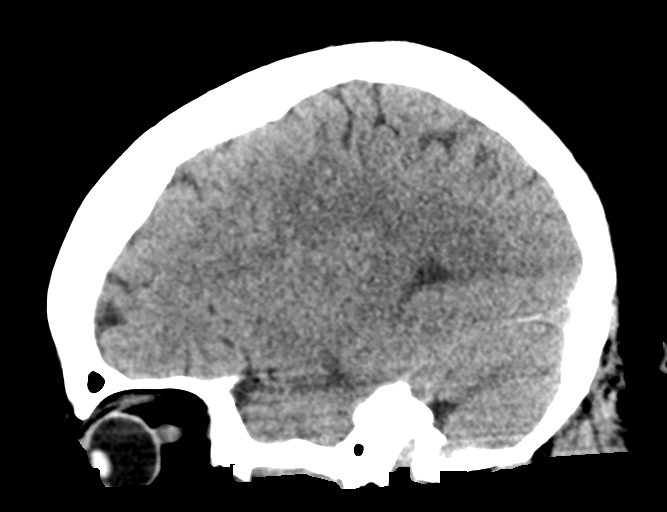
[im 30/59  brain]
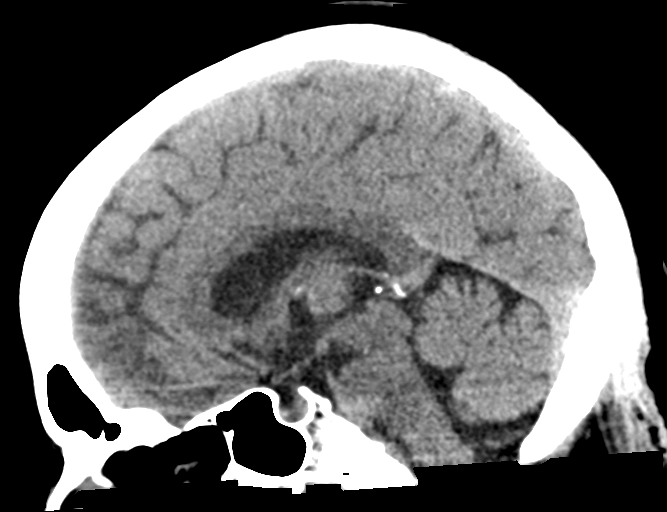
[im 39/59  brain]
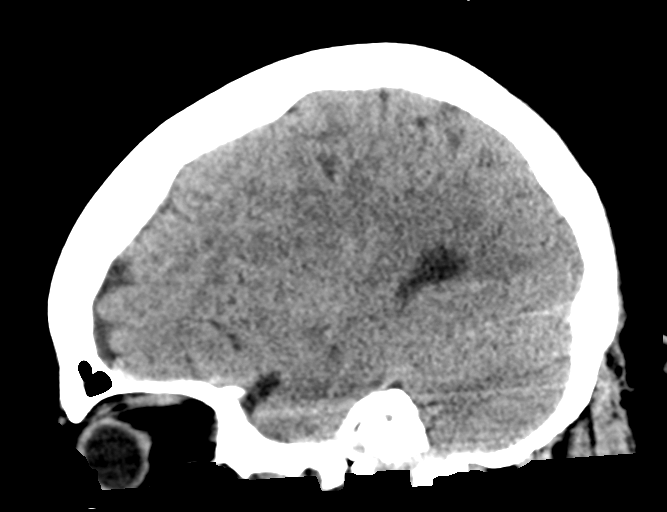

[16 of 47 positions shown; findings below may reference images not displayed]

FINDINGS: Brain:

Cerebral volume is normal.

There is no acute intracranial hemorrhage.

No demarcated cortical infarct.

No extra-axial fluid collection.

No evidence of intracranial mass.

No midline shift.

Vascular: No hyperdense vessel.  Atherosclerotic calcifications

Skull: Normal. Negative for fracture or focal lesion.

Sinuses/Orbits: Visualized orbits show no acute finding. Mild
bilateral ethmoid, sphenoid and maxillary sinus mucosal thickening
at the imaged levels.

Other: Bilateral mastoid effusions.
IMPRESSION: No evidence of acute intracranial abnormality.

Mild bilateral ethmoid, sphenoid and maxillary sinus mucosal
thickening.

Bilateral mastoid effusions.

## 2023-05-23 ENCOUNTER — Ambulatory Visit (INDEPENDENT_AMBULATORY_CARE_PROVIDER_SITE_OTHER): Payer: 59

## 2023-05-23 ENCOUNTER — Ambulatory Visit
Admission: EM | Admit: 2023-05-23 | Discharge: 2023-05-23 | Disposition: A | Discharge status: Other Acute Inpatient Hospital | Payer: 59 | Source: Home / Self Care | Attending: Family Medicine | Admitting: Family Medicine

## 2023-05-23 ENCOUNTER — Emergency Department: Payer: 59

## 2023-05-23 ENCOUNTER — Other Ambulatory Visit: Payer: Self-pay

## 2023-05-23 ENCOUNTER — Emergency Department
Admission: EM | Admit: 2023-05-23 | Discharge: 2023-05-23 | Disposition: A | Discharge status: Home / Self Care | Payer: 59 | Attending: Emergency Medicine | Admitting: Emergency Medicine

## 2023-05-23 ENCOUNTER — Encounter: Payer: Self-pay | Admitting: Emergency Medicine

## 2023-05-23 DIAGNOSIS — J40 Bronchitis, not specified as acute or chronic: Secondary | ICD-10-CM

## 2023-05-23 DIAGNOSIS — R0602 Shortness of breath: Secondary | ICD-10-CM | POA: Insufficient documentation

## 2023-05-23 DIAGNOSIS — R051 Acute cough: Secondary | ICD-10-CM

## 2023-05-23 LAB — T4, FREE: Free T4: 0.95 ng/dL (ref 0.61–1.12)

## 2023-05-23 LAB — RESP PANEL BY RT-PCR (RSV, FLU A&B, COVID)  RVPGX2
Influenza A by PCR: NEGATIVE
Influenza B by PCR: NEGATIVE
Resp Syncytial Virus by PCR: NEGATIVE
SARS Coronavirus 2 by RT PCR: NEGATIVE

## 2023-05-23 LAB — BASIC METABOLIC PANEL
Anion gap: 12 (ref 5–15)
BUN: 9 mg/dL (ref 6–20)
CO2: 20 mmol/L — ABNORMAL LOW (ref 22–32)
Calcium: 8.8 mg/dL — ABNORMAL LOW (ref 8.9–10.3)
Chloride: 104 mmol/L (ref 98–111)
Creatinine, Ser: 0.42 mg/dL — ABNORMAL LOW (ref 0.44–1.00)
GFR, Estimated: >60 mL/min (ref >60)
Glucose, Bld: 92 mg/dL (ref 70–99)
Potassium: 3.8 mmol/L (ref 3.5–5.1)
Sodium: 136 mmol/L (ref 135–145)

## 2023-05-23 LAB — TSH: TSH: 0.474 u[IU]/mL (ref 0.350–4.500)

## 2023-05-23 LAB — CBC
HCT: 39.4 % (ref 36.0–46.0)
Hemoglobin: 12.2 g/dL (ref 12.0–15.0)
MCH: 22.8 pg — ABNORMAL LOW (ref 26.0–34.0)
MCHC: 31 g/dL (ref 30.0–36.0)
MCV: 73.5 fL — ABNORMAL LOW (ref 80.0–100.0)
Platelets: 359 10*3/uL (ref 150–400)
RBC: 5.36 MIL/uL — ABNORMAL HIGH (ref 3.87–5.11)
RDW: 16.1 % — ABNORMAL HIGH (ref 11.5–15.5)
WBC: 10.8 10*3/uL — ABNORMAL HIGH (ref 4.0–10.5)
nRBC: 0 % (ref 0.0–0.2)

## 2023-05-23 LAB — RESP PANEL BY RT-PCR (FLU A&B, COVID) ARPGX2
Influenza A by PCR: NEGATIVE
Influenza B by PCR: NEGATIVE
SARS Coronavirus 2 by RT PCR: NEGATIVE

## 2023-05-23 LAB — TROPONIN I (HIGH SENSITIVITY)
Troponin I (High Sensitivity): 5 ng/L (ref <18)
Troponin I (High Sensitivity): 5 ng/L (ref <18)

## 2023-05-23 LAB — D-DIMER, QUANTITATIVE: D-Dimer, Quant: <0.27 ug{FEU}/mL (ref 0.00–0.50)

## 2023-05-23 MED ORDER — ALBUTEROL SULFATE (2.5 MG/3ML) 0.083% IN NEBU: 2.5000 mg | INHALATION_SOLUTION | Freq: Four times a day (QID) | RESPIRATORY_TRACT | 2 refills | Status: DC | PRN

## 2023-05-23 MED ORDER — IPRATROPIUM-ALBUTEROL 0.5-2.5 (3) MG/3ML IN SOLN
3.0000 mL | Freq: Once | RESPIRATORY_TRACT | Status: AC
Start: 2023-05-23 — End: 2023-05-23
  Administered 2023-05-23: 3 mL via RESPIRATORY_TRACT
  Filled 2023-05-23: qty 3

## 2023-05-23 MED ORDER — KETOROLAC TROMETHAMINE 15 MG/ML IJ SOLN
15.0000 mg | Freq: Once | INTRAMUSCULAR | Status: AC
  Administered 2023-05-23: 15 mg via INTRAVENOUS
  Filled 2023-05-23: qty 1

## 2023-05-23 MED ORDER — AMOXICILLIN-POT CLAVULANATE 875-125 MG PO TABS: 1.0000 | ORAL_TABLET | Freq: Two times a day (BID) | ORAL | 0 refills | Status: AC

## 2023-05-23 MED ORDER — DEXAMETHASONE SODIUM PHOSPHATE 10 MG/ML IJ SOLN
10.0000 mg | Freq: Once | INTRAMUSCULAR | Status: AC
  Administered 2023-05-23: 10 mg via INTRAMUSCULAR

## 2023-05-23 MED ORDER — DOXYCYCLINE HYCLATE 100 MG PO TABS: 100.0000 mg | ORAL_TABLET | Freq: Two times a day (BID) | ORAL | 0 refills | Status: AC

## 2023-05-23 MED ORDER — SODIUM CHLORIDE 0.9 % IV BOLUS
1000.0000 mL | Freq: Once | INTRAVENOUS | Status: AC
  Administered 2023-05-23: 1000 mL via INTRAVENOUS

## 2023-05-23 MED ORDER — IPRATROPIUM-ALBUTEROL 0.5-2.5 (3) MG/3ML IN SOLN
3.0000 mL | Freq: Once | RESPIRATORY_TRACT | Status: AC
  Administered 2023-05-23: 3 mL via RESPIRATORY_TRACT
  Filled 2023-05-23: qty 3

## 2023-05-23 MED ORDER — ALBUTEROL SULFATE (2.5 MG/3ML) 0.083% IN NEBU
2.5000 mg | INHALATION_SOLUTION | Freq: Once | RESPIRATORY_TRACT | Status: AC
  Administered 2023-05-23: 2.5 mg via RESPIRATORY_TRACT

## 2023-05-23 NOTE — ED Triage Notes (Signed)
Sx x 4 days  SOB Productive cough-yellow mucus

## 2023-05-23 NOTE — ED Provider Notes (Signed)
[facility] Provider Note    Event Date/Time   First MD Initiated Contact with Patient 05/23/23 1609     (approximate)   History   Chest Pain   HPI  Crystal Cabrera is a 43 y.o. female who comes in with concern for shortness of breath.  Patient was seen at urgent care I reviewed the note where patient was given 10 of dexamethasone, albuterol and sent to the ER for further evaluation.  Patient reports not feeling well for the past 4 days.  Patient states that she initially just had some sickness symptoms such as cough, congestion but over the past 2 days has noticed some increasing shortness of breath.  Patient does report she is a type I diabetic and does take insulin injections.  She does report a history of pneumonia years ago where she had to be intubated in the ICU for 2 weeks.  Since then she has developed some reactive airway disease and has intermittent wheezing.  She has an albuterol nebulizer machine at home and she is been using this without much relief.  She reports significant shortness of breath with exertion.  She denies any other risk factors for PE.  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  Physical Exam   Triage Vital Signs: ED Triage Vitals  Encounter Vitals Group     BP 05/23/23 1511 (!) 149/81     Systolic BP Percentile --      Diastolic BP Percentile --      Pulse Rate 05/23/23 1511 (!) 111     Resp 05/23/23 1511 18     Temp 05/23/23 1511 98.5 F (36.9 C)     Temp Source 05/23/23 1511 Oral     SpO2 05/23/23 1511 95 %     Weight 05/23/23 1512 215 lb (97.5 kg)     Height 05/23/23 1512 5\' 5"  (1.651 m)     Head Circumference --      Peak Flow --      Pain Score 05/23/23 1512 5     Pain Loc --      Pain Education --      Exclude from Growth Chart --     Most recent vital signs: Vitals:   05/23/23 1511  BP: (!) 149/81  Pulse: (!) 111  Resp: 18  Temp: 98.5 F (36.9 C)   SpO2: 95%     General: Awake, no distress.  CV:  Good peripheral perfusion.  Tachycardia Resp:  Normal effort.  Abd:  No distention.  Soft and nontender Other:  No swelling in legs.  No calf tenderness   ED Results / Procedures / Treatments   Labs (all labs ordered are listed, but only abnormal results are displayed) Labs Reviewed  BASIC METABOLIC PANEL - Abnormal; Notable for the following components:      Result Value   CO2 20 (*)    Creatinine, Ser 0.42 (*)    Calcium 8.8 (*)    All other components within normal limits  CBC - Abnormal; Notable for the following components:   WBC 10.8 (*)    RBC 5.36 (*)    MCV 73.5 (*)    MCH 22.8 (*)    RDW 16.1 (*)    All other components within normal limits  POC URINE PREG, ED  TROPONIN I (HIGH SENSITIVITY)     EKG  My interpretation of EKG:  Sinus tachycardia rate of 116 without any ST elevation or T wave inversions, normal intervals  RADIOLOGY I have reviewed the xray personally and interpreted and no pneumonia   PROCEDURES:  Critical Care performed: No  Procedures   MEDICATIONS ORDERED IN ED: Medications  sodium chloride 0.9 % bolus 1,000 mL (1,000 mLs Intravenous New Bag/Given 05/23/23 1650)  ipratropium-albuterol (DUONEB) 0.5-2.5 (3) MG/3ML nebulizer solution 3 mL (3 mLs Nebulization Given 05/23/23 1650)  ipratropium-albuterol (DUONEB) 0.5-2.5 (3) MG/3ML nebulizer solution 3 mL (3 mLs Nebulization Given 05/23/23 1650)  ipratropium-albuterol (DUONEB) 0.5-2.5 (3) MG/3ML nebulizer solution 3 mL (3 mLs Nebulization Given 05/23/23 1650)  ketorolac (TORADOL) 15 MG/ML injection 15 mg (15 mg Intravenous Given 05/23/23 1648)     IMPRESSION / MDM / ASSESSMENT AND PLAN / ED COURSE  I reviewed the triage vital signs and the nursing notes.   Patient's presentation is most consistent with acute presentation with potential threat to life or bodily function.   Differential includes COVID, flu, pneumonia however this test  have all been reassuring we discussed retesting with RSV and she expressed understanding.  We discussed D-dimer.  She states that the urgent care told her she needed to come here to rule out PE.  I discussed with her that this seems more likely infectious in nature but given tachycardia patient does not PERC out therefore will add on D-dimer. Given low wells score.  Will treat patient symptomatically with DuoNebs, Toradol, fluids  Troponin is negative.  BMP reassuring.  CBC shows slightly elevated white count.  Thyroid is normal D-dimer is negative COVID, flu, RSV all negative.  Troponins negative x 2.  BMP reassuring.  Patient does not meet sepsis criteria.  Heart rates were elevated  Reevaluated patient.  Heart rates are around 110 however I reviewed the cardiology note from 11/30/2021 where she was seen by cardiology and had tachycardia then with a heart rate of 119.  She has had a recent echo done in 2022 as well.  She has had this workup done by cardiology for her tachycardia and she is nearly due for her carvedilol.  Discussed with patient admission given tachycardia but given this seems to be more of a chronic issue patient declined.  Patient reports feeling better and reports having a nebulizer machine at home.  Her ambulatory sat was normal and therefore patient felt more comfortable going home and did not feel admission was needed.  However I did discuss return precautions in regards to this.  Will treat patient for bronchitis and cover for possible pneumonia being missed on x-ray.  Patient expressed understanding felt comfortable with this plan.  Holding off on prescribing prednisone given patient already received Decadron and given her type I diabetic history she does not do very well with steroids to begin with.  The patient is on the cardiac monitor to evaluate for evidence of arrhythmia and/or significant heart rate changes.      FINAL CLINICAL IMPRESSION(S) / ED DIAGNOSES   Final  diagnoses:  Bronchitis     Rx / DC Orders   ED Discharge Orders          Ordered    amoxicillin-clavulanate (AUGMENTIN) 875-125 MG tablet  2 times daily        05/23/23 1821    doxycycline (VIBRA-TABS) 100 MG tablet  2 times daily        05/23/23 1821    albuterol (PROVENTIL) (2.5 MG/3ML) 0.083% nebulizer solution  Every 6 hours PRN  05/23/23 1821             Note:  This document was prepared using Dragon voice recognition software and may include unintentional dictation errors.   Concha Se, MD 05/23/23 Rickey Primus

## 2023-05-23 NOTE — ED Triage Notes (Signed)
Patient to ED via POV for generalized CP. Pt states it started 2 days ago. Seen at [facility]- sent for abnormal EKG. States she was given breathing treatment and steroid shot- having SOB. Neg for flu and covid at [facility].

## 2023-05-23 NOTE — ED Notes (Signed)
Patient is being discharged from the Urgent Care and sent to the Emergency Department via POV . Per Dr.Brimage, patient is in need of higher level of care due to DOE. Patient is aware and verbalizes understanding of plan of care.  Vitals:   05/23/23 1110  BP: (!) 147/95  Pulse: (!) 119  Resp: 20  Temp: 99.4 F (37.4 C)  SpO2: 95%

## 2023-05-23 NOTE — Discharge Instructions (Addendum)
Your COVID and influenza tests are negative.  You were given a steroid injection and a albuterol nebulizer here. Your chest xray did not show evidence of pneumonia though the radiologist has not yet read it. If they find something that I didn't, I will call you.    You have been advised to follow up immediately in the emergency department for concerning signs or symptoms as discussed during your visit. If you declined EMS transport, please have a family member take you directly to the ED at this time. Do not delay.   Based on concerns about condition, if you do not follow up in the ED, you may risk poor outcomes including worsening of condition, delayed treatment and potentially life threatening issues. If you have declined to go to the ED at this time, you should call your PCP immediately to set up a follow up appointment.

## 2023-05-23 NOTE — Discharge Instructions (Addendum)
We are treating you for bronchitis with some antibiotics.  Were going to hold on steroids given you are diabetic and already received a Decadron shot.  You can return to the ER for worsening shortness of breath or any other concerns.

## 2023-05-23 NOTE — ED Provider Notes (Addendum)
[facility]    CSN: [id number] Arrival date & time: 05/23/23  1036      History   Chief Complaint Chief Complaint  Patient presents with   Cough   Shortness of Breath    HPI Crystal Cabrera is a 43 y.o. female.   HPI  History obtained from the patient. Crystal Cabrera presents for shortness of breath with walking short distances. Her "lungs hurt' with cough.  She is concerned that she may have pneumonia. No fever She had a sore throat, headache, diarrhea and nausea. No fever or vomiting.    No history of asthma. Denies smoking or vaping.  Has been using albuterol nebulizer.       Past Medical History:  Diagnosis Date   Diabetes mellitus without complication (HCC)    type 1   Headache    sinus   Hypertension    Scoliosis    Tachycardia    Vertigo    with sinus congestion    There are no active problems to display for this patient.   Past Surgical History:  Procedure Laterality Date   ANKLE SURGERY     CESAREAN SECTION     ETHMOIDECTOMY Bilateral 06/23/2020   Procedure: ETHMOIDECTOMY;  Surgeon: Bud Face, MD;  Location: [facility];  Service: ENT;  Laterality: Bilateral;   IMAGE GUIDED SINUS SURGERY N/A 06/23/2020   Procedure: IMAGE GUIDED SINUS SURGERY;  Surgeon: Bud Face, MD;  Location: [facility];  Service: ENT;  Laterality: N/A;  placed disk on OR CHARGE nurse desk 3-10-kp   leg sugery  rt     MAXILLARY ANTROSTOMY Bilateral 06/23/2020   Procedure: MAXILLARY ANTROSTOMY with tissue;  Surgeon: Bud Face, MD;  Location: [facility];  Service: ENT;  Laterality: Bilateral;   MYRINGOTOMY WITH TUBE PLACEMENT Bilateral 06/23/2020   Procedure: MYRINGOTOMY WITH TUBE PLACEMENT;  Surgeon: Bud Face, MD;  Location: [facility];  Service: ENT;  Laterality: Bilateral;  Diabetic - insulin   NASAL SEPTOPLASTY W/ TURBINOPLASTY Bilateral 06/23/2020   Procedure: NASAL SEPTOPLASTY WITH TURBINATE REDUCTION BALLOON;   Surgeon: Bud Face, MD;  Location: [facility];  Service: ENT;  Laterality: Bilateral;   SPHENOIDECTOMY Bilateral 06/23/2020   Procedure: Selina Cooley;  Surgeon: Bud Face, MD;  Location: [facility];  Service: ENT;  Laterality: Bilateral;    OB History   No obstetric history on file.      Home Medications    Prior to Admission medications   Medication Sig Start Date End Date Taking? Authorizing Provider  amLODipine (NORVASC) 5 MG tablet Take 1 tablet (5 mg total) by mouth daily. 09/15/19  Yes Cook, Jayce G, DO  atorvastatin (LIPITOR) 10 MG tablet Take 10 mg by mouth daily. 04/25/20  Yes [provider]  carvedilol (COREG) 12.5 MG tablet Take 1 tablet (12.5 mg total) by mouth 2 (two) times daily with a meal. 09/15/19  Yes Cook, Jayce G, DO  cloNIDine (CATAPRES) 0.1 MG tablet TAKE 1 TABLET (0.1 MG TOTAL) BY MOUTH 2 (TWO) TIMES DAILY 09/15/19  Yes Cook, Jayce G, DO  insulin aspart (FIASP FLEXTOUCH) 100 UNIT/ML FlexTouch Pen Inject 34 Units into the skin with breakfast, with lunch, and with evening meal. 09/15/19  Yes Cook, Jayce G, DO  lisinopril (ZESTRIL) 10 MG tablet Take 1 tablet (10 mg total) by mouth daily. 09/15/19  Yes Cook, Jayce G, DO  losartan (COZAAR) 50 MG tablet Take 50 mg by mouth daily. 12/28/21  Yes [provider]  Darylene Price  100 UNIT/ML FlexPen SMARTSIG:34 Unit(s) SUB-Q Twice Daily 09/20/20  Yes [provider]  TRESIBA FLEXTOUCH 200 UNIT/ML FlexTouch Pen Inject 70 Units into the skin daily. 03/25/20  Yes [provider]  azelastine (ASTELIN) 0.1 % nasal spray SMARTSIG:1-2 Spray(s) Both Nares Every 12 Hours PRN 05/18/20   [provider]  fluticasone (FLONASE) 50 MCG/ACT nasal spray Place 2 sprays into both nostrils daily. 05/18/20   [provider]  ipratropium (ATROVENT) 0.06 % nasal spray Place 2 sprays into both nostrils 4 (four) times daily. 01/20/23   Shirlee Latch, PA-C   promethazine-dextromethorphan (PROMETHAZINE-DM) 6.25-15 MG/5ML syrup Take 5 mLs by mouth 4 (four) times daily as needed. 01/20/23   Shirlee Latch, PA-C    Family History Family History  Problem Relation Age of Onset   Healthy Mother    Healthy Father     Social History Social History   Tobacco Use   Smoking status: Never   Smokeless tobacco: Never  Vaping Use   Vaping status: Never Used  Substance Use Topics   Alcohol use: Not Currently   Drug use: Never     Allergies   Lisinopril   Review of Systems Review of Systems: negative unless otherwise stated in HPI.      Physical Exam Triage Vital Signs ED Triage Vitals  Encounter Vitals Group     BP      Systolic BP Percentile      Diastolic BP Percentile      Pulse      Resp      Temp      Temp src      SpO2      Weight      Height      Head Circumference      Peak Flow      Pain Score      Pain Loc      Pain Education      Exclude from Growth Chart    No data found.  Updated Vital Signs BP (!) 147/95 (BP Location: Right Arm)   Pulse (!) 119   Temp 99.4 F (37.4 C) (Oral)   Resp 20   LMP 05/21/2023 (Exact Date)   SpO2 95%   Visual Acuity Right Eye Distance:   Left Eye Distance:   Bilateral Distance:    Right Eye Near:   Left Eye Near:    Bilateral Near:     Physical Exam GEN:     alert, ill but non-toxic appearing female in no distress    HENT:  mucus membranes moist, oropharyngeal without lesions or erythema, no tonsillar hypertrophy or exudates, clear nasal discharge, bilateral TM normal EYES:   pupils equal and reactive, no scleral injection or discharge NECK:  normal ROM, no  lymphadenopathy,  no meningismus   RESP:  no increased work of breathing,  clear to auscultation bilaterally CVS:   regular rhythm, tachycardic  Skin:   warm and dry, no rash on visible skin     UC Treatments / Results  Labs (all labs ordered are listed, but only abnormal results are displayed) Labs  Reviewed  RESP PANEL BY RT-PCR (FLU A&B, COVID) [id number]    EKG   Radiology DG Chest 2 View Result Date: 05/23/2023 CLINICAL DATA:  Productive cough and shortness of breath for 4 days. EXAM: CHEST - 2 VIEW COMPARISON:  05/21/2021 FINDINGS: The heart size and mediastinal contours are within normal limits. Both lungs are clear. The visualized skeletal structures are  unremarkable. IMPRESSION: Normal exam. Electronically Signed   By: Danae Orleans M.D.   On: 05/23/2023 12:46    Procedures Procedures (including critical care time)  Medications Ordered in UC Medications  dexamethasone (DECADRON) injection 10 mg (10 mg Intramuscular Given 05/23/23 1215)  albuterol (PROVENTIL) (2.5 MG/3ML) 0.083% nebulizer solution 2.5 mg (2.5 mg Nebulization Given 05/23/23 1216)    Initial Impression / Assessment and Plan / UC Course  I have reviewed the triage vital signs and the nursing notes.  Pertinent labs & imaging results that were available during my care of the patient were reviewed by me and considered in my medical decision making (see chart for details).       Pt is a 43 y.o. female who has HTN, HLD, IDDM,  presents for dyspnea on exertion and cough. Mabelle is afebrile here without recent antipyretics. Satting 95% on room air. Overall pt is non-toxic appearing, well hydrated, without respiratory distress. Pulmonary exam is unremarkable.  COVID and influenza panel obtained and was negative. EKG showing sinus tachycardic with anteroseptal infarct. On chest review, she has history of septal infarct and tachycardia mediated cardiomyopathy.  Chest xray personally reviewed by me without focal pneumonia, pleural effusion, cardiomegaly or pneumothorax. Patient aware the radiologist has not read her xray and is comfortable with the preliminary read by me. Will review radiologist read when available and call patient if a change in plan is warranted.  Pt agreeable to this plan prior to discharge.   Decadron 10 mg  IM and albuterol nebulizer was trialed without relief.  Discussed possibility of cardiac involvement or possibly a PE.  Risks and benefits of ED evaluation discussed and she wishes to proceed with ED evaluation. She will travel via private vehicle to [facility] ED for further cardiac evaluation. Called and spoke with triage RN who will await pts arrival.    Discussed MDM, treatment plan and plan for follow-up with patient who agrees with plan.   Radiologist impression reviewed.    Final Clinical Impressions(s) / UC Diagnoses   Final diagnoses:  SOB (shortness of breath)  Acute cough     Discharge Instructions      Your COVID and influenza tests are negative.  You were given a steroid injection and a albuterol nebulizer here. Your chest xray did not show evidence of pneumonia though the radiologist has not yet read it. If they find something that I didn't, I will call you.    You have been advised to follow up immediately in the emergency department for concerning signs or symptoms as discussed during your visit. If you declined EMS transport, please have a family member take you directly to the ED at this time. Do not delay.   Based on concerns about condition, if you do not follow up in the ED, you may risk poor outcomes including worsening of condition, delayed treatment and potentially life threatening issues. If you have declined to go to the ED at this time, you should call your PCP immediately to set up a follow up appointment.       ED Prescriptions   None    PDMP not reviewed this encounter.       Katha Cabal, DO 05/23/23 1641

## 2023-06-22 ENCOUNTER — Ambulatory Visit (INDEPENDENT_AMBULATORY_CARE_PROVIDER_SITE_OTHER)

## 2023-06-22 ENCOUNTER — Ambulatory Visit
Admission: EM | Admit: 2023-06-22 | Discharge: 2023-06-22 | Disposition: A | Discharge status: Home / Self Care | Attending: Internal Medicine | Admitting: Internal Medicine

## 2023-06-22 ENCOUNTER — Encounter: Payer: Self-pay | Admitting: Emergency Medicine

## 2023-06-22 DIAGNOSIS — J069 Acute upper respiratory infection, unspecified: Secondary | ICD-10-CM | POA: Diagnosis present

## 2023-06-22 DIAGNOSIS — R0781 Pleurodynia: Secondary | ICD-10-CM | POA: Insufficient documentation

## 2023-06-22 DIAGNOSIS — R0602 Shortness of breath: Secondary | ICD-10-CM | POA: Diagnosis present

## 2023-06-22 DIAGNOSIS — R062 Wheezing: Secondary | ICD-10-CM | POA: Insufficient documentation

## 2023-06-22 LAB — CBC WITH DIFFERENTIAL/PLATELET
Abs Immature Granulocytes: 0.02 10*3/uL (ref 0.00–0.07)
Basophils Absolute: 0.1 10*3/uL (ref 0.0–0.1)
Basophils Relative: 1 %
Eosinophils Absolute: 0.5 10*3/uL (ref 0.0–0.5)
Eosinophils Relative: 6 %
HCT: 35.4 % — ABNORMAL LOW (ref 36.0–46.0)
Hemoglobin: 11.3 g/dL — ABNORMAL LOW (ref 12.0–15.0)
Immature Granulocytes: 0 %
Lymphocytes Relative: 35 %
Lymphs Abs: 2.9 10*3/uL (ref 0.7–4.0)
MCH: 22.6 pg — ABNORMAL LOW (ref 26.0–34.0)
MCHC: 31.9 g/dL (ref 30.0–36.0)
MCV: 70.9 fL — ABNORMAL LOW (ref 80.0–100.0)
Monocytes Absolute: 0.5 10*3/uL (ref 0.1–1.0)
Monocytes Relative: 6 %
Neutro Abs: 4.2 10*3/uL (ref 1.7–7.7)
Neutrophils Relative %: 52 %
Platelets: 422 10*3/uL — ABNORMAL HIGH (ref 150–400)
RBC: 4.99 MIL/uL (ref 3.87–5.11)
RDW: 16.6 % — ABNORMAL HIGH (ref 11.5–15.5)
WBC: 8.2 10*3/uL (ref 4.0–10.5)
nRBC: 0 % (ref 0.0–0.2)

## 2023-06-22 MED ORDER — IPRATROPIUM-ALBUTEROL 0.5-2.5 (3) MG/3ML IN SOLN
3.0000 mL | Freq: Once | RESPIRATORY_TRACT | Status: AC
  Administered 2023-06-22: 3 mL via RESPIRATORY_TRACT

## 2023-06-22 MED ORDER — ALBUTEROL SULFATE 1.25 MG/3ML IN NEBU: 1.0000 | INHALATION_SOLUTION | Freq: Four times a day (QID) | RESPIRATORY_TRACT | 0 refills | Status: AC | PRN

## 2023-06-22 MED ORDER — LIDOCAINE 5 % EX PTCH: 1.0000 | MEDICATED_PATCH | CUTANEOUS | 0 refills | Status: AC

## 2023-06-22 MED ORDER — BUDESONIDE-FORMOTEROL FUMARATE 160-4.5 MCG/ACT IN AERO: 2.0000 | INHALATION_SPRAY | Freq: Two times a day (BID) | RESPIRATORY_TRACT | 0 refills | Status: AC

## 2023-06-22 NOTE — ED Triage Notes (Signed)
 Patient c/o cough and chest congestion for a week.  Patient states that she has started to have SOB and rib pain.

## 2023-06-22 NOTE — Discharge Instructions (Addendum)
 Avoid any kind of dairy  until you are better, since this makes the mucous thicker I will call you when the blood work is done  Use your albuterol nebulizer every 4 hours while awake Please follow up with your primary care doctor next week.

## 2023-06-22 NOTE — ED Provider Notes (Signed)
 [facility]    CSN: [id number] Arrival date & time: 06/22/23  1317      History   Chief Complaint Chief Complaint  Patient presents with   Cough   Rib Pain    HPI Crystal Cabrera is a 43 y.o. female who presents with recurrent chest congestion x 1 week. 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 Has been feeling SOB and having anterior lower rib pain for a few days. Was treated for bronchitis last month and got better. Her cough is productive with clear mucous. Has not had a fever, or sweats. She can't take steroid since it makes her glucose high.  She finished Augmentin and Doxy for bronchitis 2 weeks ago and states was well til last week.     Past Medical History:  Diagnosis Date   Diabetes mellitus without complication (HCC)    type 1   Headache    sinus   Hypertension    Scoliosis    Tachycardia    Vertigo    with sinus congestion    There are no active problems to display for this patient.   Past Surgical History:  Procedure Laterality Date   ANKLE SURGERY     CESAREAN SECTION     ETHMOIDECTOMY Bilateral 06/23/2020   Procedure: ETHMOIDECTOMY;  Surgeon: Bud Face, MD;  Location: [facility];  Service: ENT;  Laterality: Bilateral;   IMAGE GUIDED SINUS SURGERY N/A 06/23/2020   Procedure: IMAGE GUIDED SINUS SURGERY;  Surgeon: Bud Face, MD;  Location: [facility];  Service: ENT;  Laterality: N/A;  placed disk on OR CHARGE nurse desk 3-10-kp   leg sugery  rt     MAXILLARY ANTROSTOMY Bilateral 06/23/2020   Procedure: MAXILLARY ANTROSTOMY with tissue;  Surgeon: Bud Face, MD;  Location: [facility];  Service: ENT;  Laterality: Bilateral;   MYRINGOTOMY WITH TUBE PLACEMENT Bilateral 06/23/2020   Procedure: MYRINGOTOMY WITH TUBE PLACEMENT;  Surgeon: Bud Face, MD;  Location: [facility];  Service: ENT;  Laterality: Bilateral;  Diabetic - insulin   NASAL SEPTOPLASTY W/  TURBINOPLASTY Bilateral 06/23/2020   Procedure: NASAL SEPTOPLASTY WITH TURBINATE REDUCTION BALLOON;  Surgeon: Bud Face, MD;  Location: [facility];  Service: ENT;  Laterality: Bilateral;   SPHENOIDECTOMY Bilateral 06/23/2020   Procedure: Selina Cooley;  Surgeon: Bud Face, MD;  Location: [facility];  Service: ENT;  Laterality: Bilateral;    OB History   No obstetric history on file.      Home Medications    Prior to Admission medications   Medication Sig Start Date End Date Taking? Authorizing Provider  albuterol (ACCUNEB) 1.25 MG/3ML nebulizer solution Take 3 mLs (1.25 mg total) by nebulization every 6 (six) hours as needed for wheezing. 06/22/23  Yes Rodriguez-Southworth, Nettie Elm, PA-C  budesonide-formoterol (SYMBICORT) 160-4.5 MCG/ACT inhaler Inhale 2 puffs into the lungs 2 (two) times daily. 06/22/23  Yes Rodriguez-Southworth, Nettie Elm, PA-C  lidocaine (LIDODERM) 5 % Place 1 patch onto the skin daily. Remove & Discard patch within 12 hours or as directed by MD 06/22/23  Yes Rodriguez-Southworth, Nettie Elm, PA-C  amLODipine (NORVASC) 5 MG tablet Take 1 tablet (5 mg total) by mouth daily. 09/15/19   Tommie Sams, DO  atorvastatin (LIPITOR) 10 MG tablet Take 10 mg by mouth daily. 04/25/20   [provider]  azelastine (ASTELIN) 0.1 % nasal spray SMARTSIG:1-2 Spray(s) Both Nares Every 12 Hours PRN 05/18/20   [provider]  carvedilol (COREG) 12.5  MG tablet Take 1 tablet (12.5 mg total) by mouth 2 (two) times daily with a meal. 09/15/19   Cook, Dorie Rank G, DO  cloNIDine (CATAPRES) 0.1 MG tablet TAKE 1 TABLET (0.1 MG TOTAL) BY MOUTH 2 (TWO) TIMES DAILY 09/15/19   Cook, Jayce G, DO  fluticasone (FLONASE) 50 MCG/ACT nasal spray Place 2 sprays into both nostrils daily. 05/18/20   [provider]  insulin aspart (FIASP FLEXTOUCH) 100 UNIT/ML FlexTouch Pen Inject 34 Units into the skin with breakfast, with lunch, and with evening meal. 09/15/19   Cook, Dorie Rank G,  DO  ipratropium (ATROVENT) 0.06 % nasal spray Place 2 sprays into both nostrils 4 (four) times daily. 01/20/23   Eusebio Friendly B, PA-C  lisinopril (ZESTRIL) 10 MG tablet Take 1 tablet (10 mg total) by mouth daily. 09/15/19   Tommie Sams, DO  losartan (COZAAR) 50 MG tablet Take 50 mg by mouth daily. 12/28/21   [provider]  NOVOLOG FLEXPEN 100 UNIT/ML FlexPen SMARTSIG:34 Unit(s) SUB-Q Twice Daily 09/20/20   [provider]  promethazine-dextromethorphan (PROMETHAZINE-DM) 6.25-15 MG/5ML syrup Take 5 mLs by mouth 4 (four) times daily as needed. 01/20/23   Eusebio Friendly B, PA-C  TRESIBA FLEXTOUCH 200 UNIT/ML FlexTouch Pen Inject 70 Units into the skin daily. 03/25/20   [provider]    Family History Family History  Problem Relation Age of Onset   Healthy Mother    Healthy Father     Social History Social History   Tobacco Use   Smoking status: Never   Smokeless tobacco: Never  Vaping Use   Vaping status: Never Used  Substance Use Topics   Alcohol use: Not Currently   Drug use: Never     Allergies   Lisinopril   Review of Systems Review of Systems As noted in HPI  Physical Exam Triage Vital Signs ED Triage Vitals  Encounter Vitals Group     BP 06/22/23 1327 121/84     Systolic BP Percentile --      Diastolic BP Percentile --      Pulse Rate 06/22/23 1327 100     Resp 06/22/23 1327 15     Temp 06/22/23 1327 98.9 F (37.2 C)     Temp Source 06/22/23 1327 Oral     SpO2 06/22/23 1327 91 %     Weight 06/22/23 1325 214 lb 15.2 oz (97.5 kg)     Height 06/22/23 1325 5\' 5"  (1.651 m)     Head Circumference --      Peak Flow --      Pain Score 06/22/23 1325 8     Pain Loc --      Pain Education --      Exclude from Growth Chart --    No data found.  Updated Vital Signs BP 121/84 (BP Location: Right Arm)   Pulse 98   Temp 98.9 F (37.2 C) (Oral)   Resp 15   Ht 5\' 5"  (1.651 m)   Wt 214 lb 15.2 oz (97.5 kg)   LMP 06/18/2023  (Approximate)   SpO2 96%   BMI 35.77 kg/m   Visual Acuity Right Eye Distance:   Left Eye Distance:   Bilateral Distance:    Right Eye Near:   Left Eye Near:    Bilateral Near:     Physical Exam Physical Exam Constitutional:      General: He is not in acute distress.    Appearance: He is not toxic-appearing.  HENT:  Head: Normocephalic.     Right Ear: Tympanic membrane, ear canal and external ear normal.     Left Ear: Ear canal and external ear normal.     Nose: Nose normal.     Mouth/Throat:     Mouth: Mucous membranes are moist.     Pharynx: Oropharynx is clear.  Eyes:     General: No scleral icterus.    Conjunctiva/sclera: Conjunctivae normal.  Cardiovascular:     Rate and Rhythm: Normal rate and regular rhythm.     Heart sounds: No murmur heard.   Pulmonary:     Effort: Pulmonary effort is normal. No respiratory distress.     Breath sounds: Wheezing present.     Comments: Has auditory wheezing Musculoskeletal:        General: Normal range of motion.     Cervical back: Neck supple.  Lymphadenopathy:     Cervical: No cervical adenopathy.  Skin:    General: Skin is warm and dry.     Findings: No rash.  Neurological:     Mental Status: He is alert and oriented to person, place, and time.     Gait: Gait normal.  Psychiatric:        Mood and Affect: Mood normal.        Behavior: Behavior normal.        Thought Content: Thought content normal.        Judgment: Judgment normal.    UC Treatments / Results  Labs (all labs ordered are listed, but only abnormal results are displayed) Labs Reviewed  CBC WITH DIFFERENTIAL/PLATELET - Abnormal; Notable for the following components:      Result Value   Hemoglobin 11.3 (*)    HCT 35.4 (*)    MCV 70.9 (*)    MCH 22.6 (*)    RDW 16.6 (*)    Platelets 422 (*)    All other components within normal limits    EKG   Radiology DG Chest 2 View Result Date: 06/22/2023 CLINICAL DATA:  Shortness of breath and cough  EXAM: CHEST - 2 VIEW COMPARISON:  05/23/2023 FINDINGS: The heart size and mediastinal contours are within normal limits. Both lungs are clear. The visualized skeletal structures are unremarkable. IMPRESSION: No active cardiopulmonary disease. Electronically Signed   By: Jasmine Pang M.D.   On: 06/22/2023 15:36    Procedures Procedures (including critical care time)  Medications Ordered in UC Medications  ipratropium-albuterol (DUONEB) 0.5-2.5 (3) MG/3ML nebulizer solution 3 mL (3 mLs Nebulization Given 06/22/23 1406)    Initial Impression / Assessment and Plan / UC Course  I have reviewed the triage vital signs and the nursing notes.  Pertinent labs & imaging results that were available during my care of the patient were reviewed by me and considered in my medical decision making (see chart for details).  Viral bronchitis Rib pain more likely from coughing so much  She was placed on Symbacort inhaler every day, and I refilled her albuterol for her neb machine and advised her to use it q4h.  She was asking for antibiotics and I explained to her she has a viral infection and antibiotics will not help. See instructions.  I prescribed her Lidoderm for her rib pain as noted.    Final Clinical Impressions(s) / UC Diagnoses   Final diagnoses:  Wheezing  Painful rib     Discharge Instructions      Avoid any kind of dairy  until you are better, since this makes the  mucous thicker I will call you when the blood work is done  Use your albuterol nebulizer every 4 hours while awake Please follow up with your primary care doctor next week.      ED Prescriptions     Medication Sig Dispense Auth. Provider   lidocaine (LIDODERM) 5 % Place 1 patch onto the skin daily. Remove & Discard patch within 12 hours or as directed by MD 30 patch Rodriguez-Southworth, Nettie Elm, PA-C   albuterol (ACCUNEB) 1.25 MG/3ML nebulizer solution Take 3 mLs (1.25 mg total) by nebulization every 6 (six) hours as  needed for wheezing. 75 mL Rodriguez-Southworth, Nettie Elm, PA-C   budesonide-formoterol (SYMBICORT) 160-4.5 MCG/ACT inhaler Inhale 2 puffs into the lungs 2 (two) times daily. 1 each Rodriguez-Southworth, Nettie Elm, PA-C      PDMP not reviewed this encounter.   Garey Ham, [place] 06/22/23 1812

## 2024-01-30 IMAGING — CR DG CHEST 2V
2 series · 2 of 2 positions shown · non-contrast
Comparison: 09/25/2020

CLINICAL DATA: Cough

EXAM:
CHEST - 2 VIEW

[chest pa]
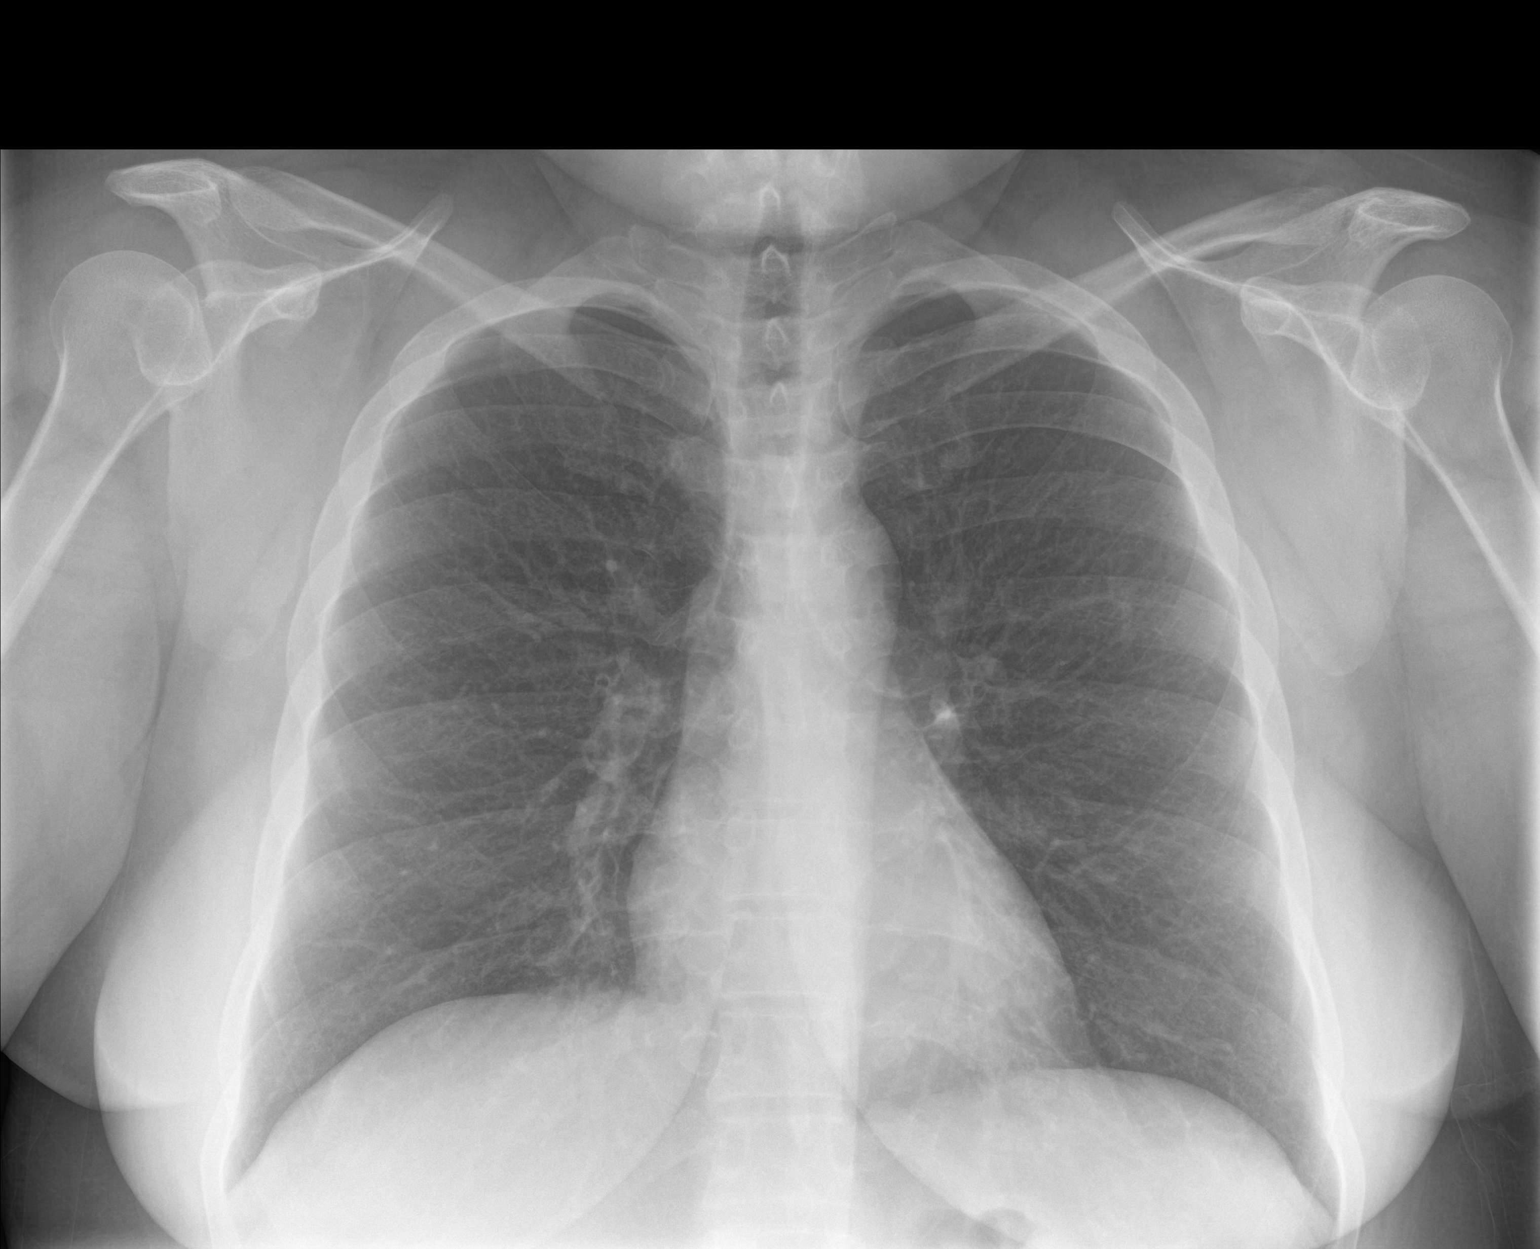

[chest lat]
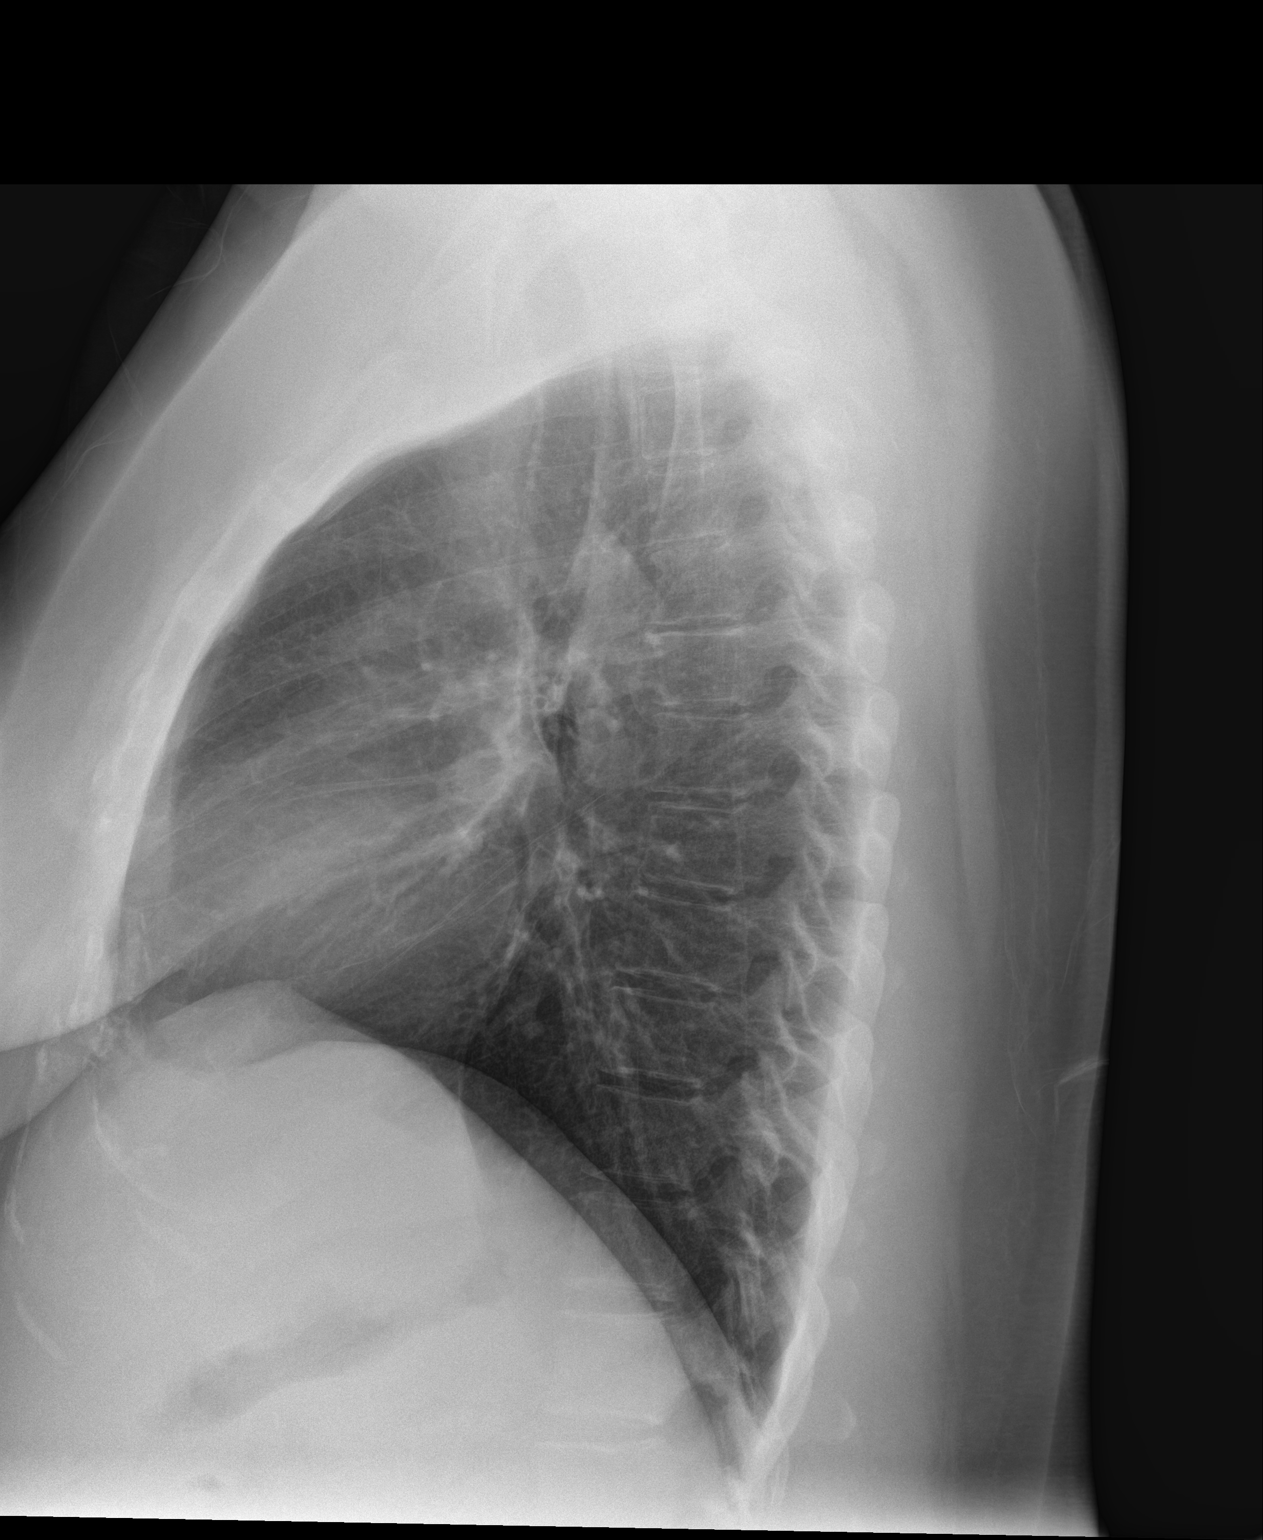

[2 of 2 positions shown; findings below may reference images not displayed]

FINDINGS: Normal heart size and mediastinal contours. No acute infiltrate or
edema. No effusion or pneumothorax. No acute osseous findings.
IMPRESSION: No active cardiopulmonary disease.

## 2024-04-20 ENCOUNTER — Ambulatory Visit
Admission: EM | Admit: 2024-04-20 | Discharge: 2024-04-20 | Disposition: A | Discharge status: Home / Self Care | Attending: Family Medicine | Admitting: Family Medicine

## 2024-04-20 DIAGNOSIS — E785 Hyperlipidemia, unspecified: Secondary | ICD-10-CM | POA: Diagnosis not present

## 2024-04-20 DIAGNOSIS — J0140 Acute pansinusitis, unspecified: Secondary | ICD-10-CM | POA: Diagnosis not present

## 2024-04-20 MED ORDER — ATORVASTATIN CALCIUM 10 MG PO TABS: 10.0000 mg | ORAL_TABLET | Freq: Every day | ORAL | 0 refills | Status: AC

## 2024-04-20 MED ORDER — LEVOFLOXACIN 500 MG PO TABS: 500.0000 mg | ORAL_TABLET | Freq: Every day | ORAL | 0 refills | Status: AC

## 2024-04-20 NOTE — ED Triage Notes (Signed)
 Sx x 3 weeks   Nasal congestion Sinus pressure Fatigue Bodyaches Bilateral ear pain  Cold chills

## 2024-04-20 NOTE — Discharge Instructions (Signed)
 Stop by the pharmacy to pick up your prescriptions.  Follow up with your primary care provider or return to the urgent care, if not improving.

## 2024-04-20 NOTE — ED Provider Notes (Signed)
 " [facility]    CSN: [id number] Arrival date & time: 04/20/24  1304      History   Chief Complaint Chief Complaint  Patient presents with   Nasal Congestion   Otalgia    HPI Diya Gervasi is a 44 y.o. female.   HPI  History obtained from the patient. Nazifa presents for nasal congestion and facial swelling for 3 weeks.  Facial pain is getting worse. She has a lot of green mucus with nasal congestion and bad headache. Endorses ear pain that radiates to her jaw. Has a tooth on that side but has not seen a dentist. She is a type 1 diabetes and her blood sugars have been fluctuating.  She is a runner, broadcasting/film/video.     Past Medical History:  Diagnosis Date   Diabetes mellitus without complication (HCC)    type 1   Headache    sinus   Hypertension    Scoliosis    Tachycardia    Vertigo    with sinus congestion    There are no active problems to display for this patient.   Past Surgical History:  Procedure Laterality Date   ANKLE SURGERY     CESAREAN SECTION     ETHMOIDECTOMY Bilateral 06/23/2020   Procedure: ETHMOIDECTOMY;  Surgeon: Milissa Hamming, MD;  Location: [facility];  Service: ENT;  Laterality: Bilateral;   IMAGE GUIDED SINUS SURGERY N/A 06/23/2020   Procedure: IMAGE GUIDED SINUS SURGERY;  Surgeon: Milissa Hamming, MD;  Location: [facility];  Service: ENT;  Laterality: N/A;  placed disk on OR CHARGE nurse desk 3-10-kp   leg sugery  rt     MAXILLARY ANTROSTOMY Bilateral 06/23/2020   Procedure: MAXILLARY ANTROSTOMY with tissue;  Surgeon: Milissa Hamming, MD;  Location: [facility];  Service: ENT;  Laterality: Bilateral;   MYRINGOTOMY WITH TUBE PLACEMENT Bilateral 06/23/2020   Procedure: MYRINGOTOMY WITH TUBE PLACEMENT;  Surgeon: Milissa Hamming, MD;  Location: [facility];  Service: ENT;  Laterality: Bilateral;  Diabetic - insulin    NASAL SEPTOPLASTY W/ TURBINOPLASTY Bilateral 06/23/2020   Procedure: NASAL SEPTOPLASTY WITH  TURBINATE REDUCTION BALLOON;  Surgeon: Milissa Hamming, MD;  Location: [facility];  Service: ENT;  Laterality: Bilateral;   SPHENOIDECTOMY Bilateral 06/23/2020   Procedure: CLEONE;  Surgeon: Milissa Hamming, MD;  Location: [facility];  Service: ENT;  Laterality: Bilateral;    OB History   No obstetric history on file.      Home Medications    Prior to Admission medications  Medication Sig Start Date End Date Taking? Authorizing Provider  amLODipine  (NORVASC ) 5 MG tablet Take 1 tablet (5 mg total) by mouth daily. 09/15/19  Yes Cook, Jayce G, DO  budesonide -formoterol  (SYMBICORT ) 160-4.5 MCG/ACT inhaler Inhale 2 puffs into the lungs 2 (two) times daily. 06/22/23  Yes Rodriguez-Southworth, Sylvia, PA-C  carvedilol  (COREG ) 12.5 MG tablet Take 1 tablet (12.5 mg total) by mouth 2 (two) times daily with a meal. 09/15/19  Yes Bluford, Jayce G, DO  insulin  aspart (FIASP  FLEXTOUCH) 100 UNIT/ML FlexTouch Pen Inject 34 Units into the skin with breakfast, with lunch, and with evening meal. 09/15/19  Yes Cook, Jayce G, DO  levofloxacin  (LEVAQUIN ) 500 MG tablet Take 1 tablet (500 mg total) by mouth daily. 04/20/24  Yes Marilena Trevathan, DO  lisinopril  (ZESTRIL ) 10 MG tablet Take 1 tablet (10 mg total) by mouth daily. 09/15/19  Yes Cook, Jayce G, DO  losartan (COZAAR) 50 MG tablet Take 50 mg by mouth daily.  12/28/21  Yes [provider]  NOVOLOG  FLEXPEN 100 UNIT/ML FlexPen SMARTSIG:34 Unit(s) SUB-Q Twice Daily 09/20/20  Yes [provider]  albuterol  (ACCUNEB ) 1.25 MG/3ML nebulizer solution Take 3 mLs (1.25 mg total) by nebulization every 6 (six) hours as needed for wheezing. 06/22/23   Rodriguez-Southworth, Sylvia, PA-C  atorvastatin  (LIPITOR) 10 MG tablet Take 1 tablet (10 mg total) by mouth daily. 04/20/24   Jibril Mcminn, DO  azelastine (ASTELIN) 0.1 % nasal spray SMARTSIG:1-2 Spray(s) Both Nares Every 12 Hours PRN 05/18/20   [provider]  cloNIDine  (CATAPRES )  0.1 MG tablet TAKE 1 TABLET (0.1 MG TOTAL) BY MOUTH 2 (TWO) TIMES DAILY 09/15/19   Cook, Jayce G, DO  fluticasone (FLONASE) 50 MCG/ACT nasal spray Place 2 sprays into both nostrils daily. 05/18/20   [provider]  ipratropium (ATROVENT ) 0.06 % nasal spray Place 2 sprays into both nostrils 4 (four) times daily. 01/20/23   Arvis Jolan NOVAK, PA-C  lidocaine  (LIDODERM ) 5 % Place 1 patch onto the skin daily. Remove & Discard patch within 12 hours or as directed by MD 06/22/23   Rodriguez-Southworth, Kyra, PA-C  promethazine -dextromethorphan (PROMETHAZINE -DM) 6.25-15 MG/5ML syrup Take 5 mLs by mouth 4 (four) times daily as needed. 01/20/23   Arvis Jolan B, PA-C  TRESIBA FLEXTOUCH 200 UNIT/ML FlexTouch Pen Inject 70 Units into the skin daily. 03/25/20   [provider]    Family History Family History  Problem Relation Age of Onset   Healthy Mother    Healthy Father     Social History Social History[1]   Allergies   Lisinopril    Review of Systems Review of Systems: negative unless otherwise stated in HPI.      Physical Exam Triage Vital Signs ED Triage Vitals  Encounter Vitals Group     BP      Girls Systolic BP Percentile      Girls Diastolic BP Percentile      Boys Systolic BP Percentile      Boys Diastolic BP Percentile      Pulse      Resp      Temp      Temp src      SpO2      Weight      Height      Head Circumference      Peak Flow      Pain Score      Pain Loc      Pain Education      Exclude from Growth Chart    No data found.  Updated Vital Signs BP 138/75 (BP Location: Right Arm)   Pulse (!) 104   Temp 98.9 F (37.2 C) (Oral)   Resp 18   Wt 95.3 kg   LMP 04/01/2024 (Exact Date)   SpO2 96%   BMI 34.95 kg/m   Visual Acuity Right Eye Distance:   Left Eye Distance:   Bilateral Distance:    Right Eye Near:   Left Eye Near:    Bilateral Near:     Physical Exam GEN:     alert, non-toxic appearing female in no distress    HENT:   mucus membranes moist, oropharyngeal without lesions or erythema, no tonsillar hypertrophy or exudates,  moderate erythematous edematous turbinates, thick nasal discharge, +bilateral maxillary sinus tenderness > bilateral frontal sinus tenderness EYES:   no scleral injection or discharge NECK:  normal ROM, no meningismus   RESP:  no increased work of breathing, clear to auscultation bilaterally CVS:  regular rate and rhythm Skin:   warm and dry    UC Treatments / Results  Labs (all labs ordered are listed, but only abnormal results are displayed) Labs Reviewed - No data to display  EKG   Radiology No results found.   Procedures Procedures (including critical care time)  Medications Ordered in UC Medications - No data to display  Initial Impression / Assessment and Plan / UC Course  I have reviewed the triage vital signs and the nursing notes.  Pertinent labs & imaging results that were available during my care of the patient were reviewed by me and considered in my medical decision making (see chart for details).      Pt is a 44 y.o. female who presents for 3 weeks of cough that is not improving.  Albertha is  afebrile here without recent antipyretics. Satting well on room air. Overall pt is  non-toxic appearing, well hydrated, without respiratory distress.  On exam, she has frontal and maxillary and ethmoid sinus tenderness bilaterally.  COVID  and influenza testing deferred due to length of symptoms.  I suspect that she could have pansinusitis therefore we will treat with antibiotics as below.   Typical duration of symptoms discussed.   For hyperlipidemia: She is out of her cholesterol medication.  She has a follow-up appointment scheduled with her primary care doctor.  This medication was refilled.   Return and ED precautions given and patient voiced understanding. Discussed MDM, treatment plan and plan for follow-up with patient who agrees with plan.     Final Clinical  Impressions(s) / UC Diagnoses   Final diagnoses:  Acute pansinusitis, recurrence not specified  Hyperlipidemia, unspecified hyperlipidemia type     Discharge Instructions      Stop by the pharmacy to pick up your prescriptions.  Follow up with your primary care provider or return to the urgent care, if not improving.       ED Prescriptions     Medication Sig Dispense Auth. Provider   levofloxacin  (LEVAQUIN ) 500 MG tablet Take 1 tablet (500 mg total) by mouth daily. 7 tablet Niobe Dick, DO   atorvastatin  (LIPITOR) 10 MG tablet Take 1 tablet (10 mg total) by mouth daily. 30 tablet Miguelina Fore, DO      PDMP not reviewed this encounter.     [1]  Social History Tobacco Use   Smoking status: Never   Smokeless tobacco: Never  Vaping Use   Vaping status: Never Used  Substance Use Topics   Alcohol use: Not Currently   Drug use: Never     Kriste Berth, DO 04/23/24 1121  "

## 2024-05-12 ENCOUNTER — Ambulatory Visit: Admitting: Physician Assistant
